# Patient Record
Sex: Male | Born: 1945 | Race: Black or African American | Hispanic: No | Marital: Married | Smoking: Former smoker
Health system: Southern US, Community
[De-identification: ages and names within clinical notes are randomized; demographics above are authoritative.]

## PROBLEM LIST (undated history)

## (undated) DIAGNOSIS — Z9889 Other specified postprocedural states: Secondary | ICD-10-CM

## (undated) DIAGNOSIS — I10 Essential (primary) hypertension: Secondary | ICD-10-CM

## (undated) DIAGNOSIS — A3790 Whooping cough, unspecified species without pneumonia: Secondary | ICD-10-CM

## (undated) DIAGNOSIS — E785 Hyperlipidemia, unspecified: Secondary | ICD-10-CM

## (undated) DIAGNOSIS — H409 Unspecified glaucoma: Secondary | ICD-10-CM

## (undated) DIAGNOSIS — E291 Testicular hypofunction: Secondary | ICD-10-CM

## (undated) DIAGNOSIS — G4730 Sleep apnea, unspecified: Secondary | ICD-10-CM

## (undated) HISTORY — PX: TONSILLECTOMY: SUR1361

## (undated) HISTORY — DX: Unspecified glaucoma: H40.9

## (undated) HISTORY — DX: Essential (primary) hypertension: I10

## (undated) HISTORY — DX: Other specified postprocedural states: Z98.890

## (undated) HISTORY — DX: Whooping cough, unspecified species without pneumonia: A37.90

## (undated) HISTORY — DX: Testicular hypofunction: E29.1

## (undated) HISTORY — DX: Sleep apnea, unspecified: G47.30

## (undated) HISTORY — DX: Hyperlipidemia, unspecified: E78.5

---

## 1997-09-26 ENCOUNTER — Ambulatory Visit (HOSPITAL_COMMUNITY): Admission: RE | Admit: 1997-09-26 | Discharge: 1997-09-26 | Payer: Self-pay | Admitting: Nephrology

## 1999-02-19 ENCOUNTER — Emergency Department (HOSPITAL_COMMUNITY): Admission: EM | Admit: 1999-02-19 | Discharge: 1999-02-19 | Payer: Self-pay | Admitting: Emergency Medicine

## 2000-08-19 ENCOUNTER — Ambulatory Visit (HOSPITAL_BASED_OUTPATIENT_CLINIC_OR_DEPARTMENT_OTHER): Admission: RE | Admit: 2000-08-19 | Discharge: 2000-08-19 | Payer: Self-pay | Admitting: Otolaryngology

## 2000-10-04 ENCOUNTER — Ambulatory Visit (HOSPITAL_BASED_OUTPATIENT_CLINIC_OR_DEPARTMENT_OTHER): Admission: RE | Admit: 2000-10-04 | Discharge: 2000-10-05 | Payer: Self-pay | Admitting: Otolaryngology

## 2000-10-04 ENCOUNTER — Encounter (INDEPENDENT_AMBULATORY_CARE_PROVIDER_SITE_OTHER): Payer: Self-pay | Admitting: Specialist

## 2001-01-24 ENCOUNTER — Ambulatory Visit (HOSPITAL_BASED_OUTPATIENT_CLINIC_OR_DEPARTMENT_OTHER): Admission: RE | Admit: 2001-01-24 | Discharge: 2001-01-24 | Payer: Self-pay | Admitting: Otolaryngology

## 2003-12-23 ENCOUNTER — Encounter: Admission: RE | Admit: 2003-12-23 | Discharge: 2003-12-23 | Payer: Self-pay | Admitting: Nephrology

## 2004-01-24 ENCOUNTER — Ambulatory Visit (HOSPITAL_COMMUNITY): Admission: RE | Admit: 2004-01-24 | Discharge: 2004-01-24 | Payer: Self-pay | Admitting: Nephrology

## 2004-02-20 ENCOUNTER — Ambulatory Visit (HOSPITAL_COMMUNITY): Admission: RE | Admit: 2004-02-20 | Discharge: 2004-02-20 | Payer: Self-pay | Admitting: Gastroenterology

## 2004-11-16 ENCOUNTER — Encounter: Admission: RE | Admit: 2004-11-16 | Discharge: 2004-11-16 | Payer: Self-pay | Admitting: Nephrology

## 2005-03-09 ENCOUNTER — Ambulatory Visit (HOSPITAL_COMMUNITY): Admission: RE | Admit: 2005-03-09 | Discharge: 2005-03-09 | Payer: Self-pay | Admitting: Nephrology

## 2005-05-26 ENCOUNTER — Encounter: Admission: RE | Admit: 2005-05-26 | Discharge: 2005-05-26 | Payer: Self-pay | Admitting: Nephrology

## 2005-08-30 ENCOUNTER — Emergency Department (HOSPITAL_COMMUNITY): Admission: EM | Admit: 2005-08-30 | Discharge: 2005-08-30 | Payer: Self-pay | Admitting: Emergency Medicine

## 2005-09-13 ENCOUNTER — Ambulatory Visit (HOSPITAL_COMMUNITY): Admission: RE | Admit: 2005-09-13 | Discharge: 2005-09-13 | Payer: Self-pay | Admitting: Nephrology

## 2006-08-21 ENCOUNTER — Emergency Department (HOSPITAL_COMMUNITY): Admission: EM | Admit: 2006-08-21 | Discharge: 2006-08-21 | Payer: Self-pay | Admitting: Emergency Medicine

## 2007-06-22 ENCOUNTER — Encounter: Admission: RE | Admit: 2007-06-22 | Discharge: 2007-06-22 | Payer: Self-pay | Admitting: Internal Medicine

## 2009-12-07 ENCOUNTER — Inpatient Hospital Stay (HOSPITAL_COMMUNITY): Admission: EM | Admit: 2009-12-07 | Discharge: 2009-12-10 | Payer: Self-pay | Admitting: Emergency Medicine

## 2009-12-09 ENCOUNTER — Encounter (INDEPENDENT_AMBULATORY_CARE_PROVIDER_SITE_OTHER): Payer: Self-pay | Admitting: Internal Medicine

## 2010-06-04 LAB — CBC
HCT: 31.1 % — ABNORMAL LOW (ref 39.0–52.0)
HCT: 31.3 % — ABNORMAL LOW (ref 39.0–52.0)
HCT: 33.7 % — ABNORMAL LOW (ref 39.0–52.0)
HCT: 35.8 % — ABNORMAL LOW (ref 39.0–52.0)
Hemoglobin: 10.5 g/dL — ABNORMAL LOW (ref 13.0–17.0)
Hemoglobin: 10.5 g/dL — ABNORMAL LOW (ref 13.0–17.0)
Hemoglobin: 10.6 g/dL — ABNORMAL LOW (ref 13.0–17.0)
Hemoglobin: 11 g/dL — ABNORMAL LOW (ref 13.0–17.0)
Hemoglobin: 11.2 g/dL — ABNORMAL LOW (ref 13.0–17.0)
Hemoglobin: 12 g/dL — ABNORMAL LOW (ref 13.0–17.0)
MCH: 28.6 pg (ref 26.0–34.0)
MCH: 28.6 pg (ref 26.0–34.0)
MCH: 28.9 pg (ref 26.0–34.0)
MCH: 29 pg (ref 26.0–34.0)
MCHC: 33.4 g/dL (ref 30.0–36.0)
MCHC: 33.5 g/dL (ref 30.0–36.0)
MCHC: 33.8 g/dL (ref 30.0–36.0)
MCV: 85.3 fL (ref 78.0–100.0)
MCV: 85.3 fL (ref 78.0–100.0)
MCV: 85.9 fL (ref 78.0–100.0)
MCV: 86.5 fL (ref 78.0–100.0)
Platelets: 149 10*3/uL — ABNORMAL LOW (ref 150–400)
Platelets: 168 10*3/uL (ref 150–400)
RBC: 3.62 MIL/uL — ABNORMAL LOW (ref 4.22–5.81)
RBC: 3.72 MIL/uL — ABNORMAL LOW (ref 4.22–5.81)
RBC: 3.84 MIL/uL — ABNORMAL LOW (ref 4.22–5.81)
RBC: 4.14 MIL/uL — ABNORMAL LOW (ref 4.22–5.81)
RDW: 13.9 % (ref 11.5–15.5)
RDW: 14.1 % (ref 11.5–15.5)
WBC: 3.4 10*3/uL — ABNORMAL LOW (ref 4.0–10.5)
WBC: 3.9 10*3/uL — ABNORMAL LOW (ref 4.0–10.5)
WBC: 4 10*3/uL (ref 4.0–10.5)
WBC: 7.1 10*3/uL (ref 4.0–10.5)

## 2010-06-04 LAB — URINALYSIS, ROUTINE W REFLEX MICROSCOPIC
Bilirubin Urine: NEGATIVE
Glucose, UA: NEGATIVE mg/dL
Hgb urine dipstick: NEGATIVE
Leukocytes, UA: NEGATIVE
Nitrite: NEGATIVE
Protein, ur: 30 mg/dL — AB
Specific Gravity, Urine: 1.033 — ABNORMAL HIGH (ref 1.005–1.030)
Urobilinogen, UA: 1 mg/dL (ref 0.0–1.0)
pH: 5.5 (ref 5.0–8.0)

## 2010-06-04 LAB — URINE MICROSCOPIC-ADD ON

## 2010-06-04 LAB — COMPREHENSIVE METABOLIC PANEL
ALT: 14 U/L (ref 0–53)
AST: 15 U/L (ref 0–37)
Albumin: 3 g/dL — ABNORMAL LOW (ref 3.5–5.2)
Alkaline Phosphatase: 71 U/L (ref 39–117)
BUN: 15 mg/dL (ref 6–23)
CO2: 31 mEq/L (ref 19–32)
Calcium: 8.7 mg/dL (ref 8.4–10.5)
Chloride: 109 mEq/L (ref 96–112)
Creatinine, Ser: 1.21 mg/dL (ref 0.4–1.5)
GFR calc Af Amer: >60 mL/min (ref >60)
GFR calc non Af Amer: >60 mL/min (ref >60)
Glucose, Bld: 104 mg/dL — ABNORMAL HIGH (ref 70–99)
Potassium: 3.4 mEq/L — ABNORMAL LOW (ref 3.5–5.1)
Sodium: 144 mEq/L (ref 135–145)
Total Bilirubin: 0.5 mg/dL (ref 0.3–1.2)
Total Protein: 5.6 g/dL — ABNORMAL LOW (ref 6.0–8.3)

## 2010-06-04 LAB — DIFFERENTIAL
Basophils Absolute: 0 10*3/uL (ref 0.0–0.1)
Basophils Absolute: 0 10*3/uL (ref 0.0–0.1)
Basophils Relative: 0 % (ref 0–1)
Basophils Relative: 0 % (ref 0–1)
Eosinophils Absolute: 0.2 10*3/uL (ref 0.0–0.7)
Eosinophils Absolute: 0.3 10*3/uL (ref 0.0–0.7)
Eosinophils Absolute: 0.3 10*3/uL (ref 0.0–0.7)
Eosinophils Relative: 3 % (ref 0–5)
Eosinophils Relative: 7 % — ABNORMAL HIGH (ref 0–5)
Eosinophils Relative: 9 % — ABNORMAL HIGH (ref 0–5)
Lymphocytes Relative: 13 % (ref 12–46)
Lymphocytes Relative: 36 % (ref 12–46)
Lymphs Abs: 0.9 10*3/uL (ref 0.7–4.0)
Lymphs Abs: 1.4 10*3/uL (ref 0.7–4.0)
Lymphs Abs: 1.6 10*3/uL (ref 0.7–4.0)
Monocytes Absolute: 0.3 10*3/uL (ref 0.1–1.0)
Monocytes Absolute: 0.3 10*3/uL (ref 0.1–1.0)
Monocytes Relative: 4 % (ref 3–12)
Monocytes Relative: 7 % (ref 3–12)
Monocytes Relative: 8 % (ref 3–12)
Neutro Abs: 2 10*3/uL (ref 1.7–7.7)
Neutro Abs: 5.7 10*3/uL (ref 1.7–7.7)
Neutrophils Relative %: 49 % (ref 43–77)
Neutrophils Relative %: 80 % — ABNORMAL HIGH (ref 43–77)

## 2010-06-04 LAB — PROTIME-INR
INR: 1.07 (ref 0.00–1.49)
Prothrombin Time: 14.1 seconds (ref 11.6–15.2)

## 2010-06-04 LAB — HEMOGLOBIN AND HEMATOCRIT, BLOOD
HCT: 32.2 % — ABNORMAL LOW (ref 39.0–52.0)
HCT: 32.6 % — ABNORMAL LOW (ref 39.0–52.0)
Hemoglobin: 10.7 g/dL — ABNORMAL LOW (ref 13.0–17.0)

## 2010-06-04 LAB — POCT I-STAT, CHEM 8
BUN: 21 mg/dL (ref 6–23)
Calcium, Ion: 1.18 mmol/L (ref 1.12–1.32)
Chloride: 106 mEq/L (ref 96–112)
Creatinine, Ser: 1.3 mg/dL (ref 0.4–1.5)
Glucose, Bld: 103 mg/dL — ABNORMAL HIGH (ref 70–99)
HCT: 37 % — ABNORMAL LOW (ref 39.0–52.0)
Hemoglobin: 12.6 g/dL — ABNORMAL LOW (ref 13.0–17.0)
Potassium: 3.6 mEq/L (ref 3.5–5.1)
Sodium: 144 mEq/L (ref 135–145)
TCO2: 29 mmol/L (ref 0–100)

## 2010-06-04 LAB — ABO/RH: ABO/RH(D): O POS

## 2010-06-04 LAB — HEMOCCULT GUIAC POC 1CARD (OFFICE): Fecal Occult Bld: POSITIVE

## 2010-06-04 LAB — TYPE AND SCREEN
ABO/RH(D): O POS
Antibody Screen: NEGATIVE

## 2010-06-04 LAB — APTT: aPTT: 31 seconds (ref 24–37)

## 2010-06-04 LAB — MAGNESIUM: Magnesium: 2.2 mg/dL (ref 1.5–2.5)

## 2010-08-07 NOTE — Op Note (Signed)
[place]  Patient:    Richard Hudson, Richard Hudson                     MRN: [id number] Proc. Date: 10/04/00 Adm. Date:  [id number] Attending:  Carlean Purl CC:         Jarome Matin, M.D.   Operative Report  PREOPERATIVE DIAGNOSES: 1. Obstructive sleep apnea. 2. Turbinate hypertrophy with nasal obstruction.  POSTOPERATIVE DIAGNOSES: 1. Obstructive sleep apnea. 2. Turbinate hypertrophy with nasal obstruction.  OPERATION: 1. Uvulopalatopharyngoplasty with tonsillectomy. 2. Bilateral inferior turbinate reductions.  SURGEON:  Kristine Garbe. Ezzard Standing, M.D.  ANESTHESIA:  General endotracheal.  COMPLICATIONS:  None.  BRIEF CLINICAL NOTE:  Richard Hudson is a 65 year old gentleman who has history of heavy snoring as well as some obstructive symptoms at night.  He had a sleep test which demonstrated an RDI of 34 with O2 saturation down to the mid 80s.  He did not tolerate the nasal CPAP trial.  He is taken to the operating room at this time for a UPP and a turbinate reduction.  DESCRIPTION OF PROCEDURE:  After adequate endotracheal anesthesia, the patient received 1 gm Ancef preoperatively and 12 mg of Decadron IV intraoperatively. Mouth gag was used to expose the oral pharynx.  The left and right tonsils were resected from the tonsillar pockets using a cautery.  After removing the tonsils, the distal 1/2 cm of soft palpate in the uvula was transected at its base.  Hemostasis was obtained with cautery.  The mucosal edges on either side of the soft palpate were then reapproximated with 3-0 Vicryl and 3-0 chromic sutures.  This completed the UPP and tonsillectomy.  Next, the nose was examined.  The inferior turbinates were decongested with cotton pledgets soaked in decongestants.  Richard Hudson had large swollen inferior turbinates. Bipolar cautery was used to perform submucosal cauterization of the turbinate. The patient had a slight septal  deflection to the left.  The inferior turbinates were then out fractured.  This completed the procedure.  Richard Hudson was awakened from anesthesia and transferred to the recovery room postoperative doing well.  DISPOSITION:  Richard Hudson will be observed overnight in the recovery care center and discharged home in the morning on Keflex suspension 250 t.i.d. for one week, Tylenol and Lortab elixir 15 to 30 cc q.4h. p.r.n. pain.  Will have him follow up in my office in two weeks for recheck. DD:  10/04/00 TD:  10/04/00 Job: [id number]

## 2010-08-07 NOTE — Op Note (Signed)
NAMEGID, SCHOFFSTALL              ACCOUNT NO.:  [id number]   MEDICAL RECORD NO.:  [id number]          PATIENT TYPE:  AMB   LOCATION:  ENDO                         FACILITY:  [facility]   PHYSICIAN:  James L. Malon Kindle., M.D.DATE OF BIRTH:  Sep 06, 1945   DATE OF PROCEDURE:  02/20/2004  DATE OF DISCHARGE:                                 OPERATIVE REPORT   PROCEDURE PERFORMED:  Colonoscopy.   ENDOSCOPIST:  Llana Aliment. Randa Evens, M.D.   MEDICATIONS:  Fentanyl 75 mcg, Versed 10 mg IV.   INDICATIONS FOR PROCEDURE:  Colon cancer screening.   DESCRIPTION OF PROCEDURE:  The procedure had been explained to the patient  and consent obtained.  With the patient in the left lateral decubitus  position, the pediatric adjustable Olympus colonoscope was inserted and  advanced.  The prep was adequate and we were able to advance to the cecum.  The ileocecal valve and appendiceal orifice were seen.  The scope was  withdrawn and the cecum, ascending colon, transverse colon, descending and  sigmoid colon were seen well upon removal.  No polyps or other lesions were  seen.  The scope was withdrawn to the rectum.  The rectum was free of  polyps.  The scope was withdrawn.  The patient tolerated the procedure well.   ASSESSMENT:  Normal screening colonoscopy, [id number].   PLAN:  Will recommend yearly Hemoccults and follow-up in the future with  Jarome Matin, M.D.  Repeat colonoscopy for specific indications.       JLE/MEDQ  D:  02/20/2004  T:  02/20/2004  Job:  [id number]   cc:   Jarome Matin, M.D.  [address] [place]  [place] [address]  Fax: [phone]

## 2011-04-28 DIAGNOSIS — I10 Essential (primary) hypertension: Secondary | ICD-10-CM | POA: Diagnosis not present

## 2011-07-06 DIAGNOSIS — N400 Enlarged prostate without lower urinary tract symptoms: Secondary | ICD-10-CM | POA: Diagnosis not present

## 2011-07-06 DIAGNOSIS — E291 Testicular hypofunction: Secondary | ICD-10-CM | POA: Diagnosis not present

## 2011-07-06 DIAGNOSIS — R109 Unspecified abdominal pain: Secondary | ICD-10-CM | POA: Diagnosis not present

## 2011-07-06 DIAGNOSIS — E785 Hyperlipidemia, unspecified: Secondary | ICD-10-CM | POA: Diagnosis not present

## 2011-07-06 DIAGNOSIS — Z23 Encounter for immunization: Secondary | ICD-10-CM | POA: Diagnosis not present

## 2011-07-06 DIAGNOSIS — Z0000 Encounter for general adult medical examination without abnormal findings: Secondary | ICD-10-CM | POA: Diagnosis not present

## 2011-07-06 DIAGNOSIS — Z79899 Other long term (current) drug therapy: Secondary | ICD-10-CM | POA: Diagnosis not present

## 2011-07-06 DIAGNOSIS — I10 Essential (primary) hypertension: Secondary | ICD-10-CM | POA: Diagnosis not present

## 2011-07-28 DIAGNOSIS — H4011X0 Primary open-angle glaucoma, stage unspecified: Secondary | ICD-10-CM | POA: Diagnosis not present

## 2011-07-28 DIAGNOSIS — H40029 Open angle with borderline findings, high risk, unspecified eye: Secondary | ICD-10-CM | POA: Diagnosis not present

## 2011-07-28 DIAGNOSIS — H409 Unspecified glaucoma: Secondary | ICD-10-CM | POA: Diagnosis not present

## 2011-08-31 DIAGNOSIS — H4011X0 Primary open-angle glaucoma, stage unspecified: Secondary | ICD-10-CM | POA: Diagnosis not present

## 2011-08-31 DIAGNOSIS — H02139 Senile ectropion of unspecified eye, unspecified eyelid: Secondary | ICD-10-CM | POA: Diagnosis not present

## 2011-08-31 DIAGNOSIS — H40029 Open angle with borderline findings, high risk, unspecified eye: Secondary | ICD-10-CM | POA: Diagnosis not present

## 2011-08-31 DIAGNOSIS — H409 Unspecified glaucoma: Secondary | ICD-10-CM | POA: Diagnosis not present

## 2011-10-18 DIAGNOSIS — H01029 Squamous blepharitis unspecified eye, unspecified eyelid: Secondary | ICD-10-CM | POA: Diagnosis not present

## 2011-10-18 DIAGNOSIS — H04569 Stenosis of unspecified lacrimal punctum: Secondary | ICD-10-CM | POA: Diagnosis not present

## 2011-10-18 DIAGNOSIS — M2420 Disorder of ligament, unspecified site: Secondary | ICD-10-CM | POA: Diagnosis not present

## 2011-10-18 DIAGNOSIS — H11439 Conjunctival hyperemia, unspecified eye: Secondary | ICD-10-CM | POA: Diagnosis not present

## 2011-10-18 DIAGNOSIS — H04129 Dry eye syndrome of unspecified lacrimal gland: Secondary | ICD-10-CM | POA: Diagnosis not present

## 2011-10-18 DIAGNOSIS — H02139 Senile ectropion of unspecified eye, unspecified eyelid: Secondary | ICD-10-CM | POA: Diagnosis not present

## 2011-12-27 DIAGNOSIS — I10 Essential (primary) hypertension: Secondary | ICD-10-CM | POA: Diagnosis not present

## 2012-01-06 DIAGNOSIS — N400 Enlarged prostate without lower urinary tract symptoms: Secondary | ICD-10-CM | POA: Diagnosis not present

## 2012-01-06 DIAGNOSIS — E785 Hyperlipidemia, unspecified: Secondary | ICD-10-CM | POA: Diagnosis not present

## 2012-01-06 DIAGNOSIS — R809 Proteinuria, unspecified: Secondary | ICD-10-CM | POA: Diagnosis not present

## 2012-01-06 DIAGNOSIS — E291 Testicular hypofunction: Secondary | ICD-10-CM | POA: Diagnosis not present

## 2012-01-06 DIAGNOSIS — I10 Essential (primary) hypertension: Secondary | ICD-10-CM | POA: Diagnosis not present

## 2012-01-28 DIAGNOSIS — I77819 Aortic ectasia, unspecified site: Secondary | ICD-10-CM | POA: Diagnosis not present

## 2012-02-22 DIAGNOSIS — H9319 Tinnitus, unspecified ear: Secondary | ICD-10-CM | POA: Diagnosis not present

## 2012-02-29 DIAGNOSIS — H02109 Unspecified ectropion of unspecified eye, unspecified eyelid: Secondary | ICD-10-CM | POA: Diagnosis not present

## 2012-03-02 DIAGNOSIS — H4011X0 Primary open-angle glaucoma, stage unspecified: Secondary | ICD-10-CM | POA: Diagnosis not present

## 2012-03-02 DIAGNOSIS — H40029 Open angle with borderline findings, high risk, unspecified eye: Secondary | ICD-10-CM | POA: Diagnosis not present

## 2012-03-02 DIAGNOSIS — H02139 Senile ectropion of unspecified eye, unspecified eyelid: Secondary | ICD-10-CM | POA: Diagnosis not present

## 2012-03-29 DIAGNOSIS — H9319 Tinnitus, unspecified ear: Secondary | ICD-10-CM | POA: Diagnosis not present

## 2012-03-29 DIAGNOSIS — H903 Sensorineural hearing loss, bilateral: Secondary | ICD-10-CM | POA: Diagnosis not present

## 2012-03-29 DIAGNOSIS — H905 Unspecified sensorineural hearing loss: Secondary | ICD-10-CM | POA: Diagnosis not present

## 2012-04-06 DIAGNOSIS — I10 Essential (primary) hypertension: Secondary | ICD-10-CM | POA: Diagnosis not present

## 2012-04-06 DIAGNOSIS — Z01818 Encounter for other preprocedural examination: Secondary | ICD-10-CM | POA: Diagnosis not present

## 2012-04-06 DIAGNOSIS — H02109 Unspecified ectropion of unspecified eye, unspecified eyelid: Secondary | ICD-10-CM | POA: Diagnosis not present

## 2012-04-06 DIAGNOSIS — I498 Other specified cardiac arrhythmias: Secondary | ICD-10-CM | POA: Diagnosis not present

## 2012-04-17 DIAGNOSIS — I10 Essential (primary) hypertension: Secondary | ICD-10-CM | POA: Diagnosis not present

## 2012-04-17 DIAGNOSIS — E785 Hyperlipidemia, unspecified: Secondary | ICD-10-CM | POA: Diagnosis not present

## 2012-04-17 DIAGNOSIS — H02109 Unspecified ectropion of unspecified eye, unspecified eyelid: Secondary | ICD-10-CM | POA: Diagnosis not present

## 2012-04-24 DIAGNOSIS — Z9889 Other specified postprocedural states: Secondary | ICD-10-CM | POA: Diagnosis not present

## 2012-05-23 DIAGNOSIS — Z48810 Encounter for surgical aftercare following surgery on the sense organs: Secondary | ICD-10-CM | POA: Diagnosis not present

## 2012-05-23 DIAGNOSIS — H02109 Unspecified ectropion of unspecified eye, unspecified eyelid: Secondary | ICD-10-CM | POA: Diagnosis not present

## 2012-05-23 DIAGNOSIS — Z87891 Personal history of nicotine dependence: Secondary | ICD-10-CM | POA: Diagnosis not present

## 2012-07-04 DIAGNOSIS — I129 Hypertensive chronic kidney disease with stage 1 through stage 4 chronic kidney disease, or unspecified chronic kidney disease: Secondary | ICD-10-CM | POA: Diagnosis not present

## 2012-07-04 DIAGNOSIS — H9319 Tinnitus, unspecified ear: Secondary | ICD-10-CM | POA: Diagnosis not present

## 2012-07-04 DIAGNOSIS — E785 Hyperlipidemia, unspecified: Secondary | ICD-10-CM | POA: Diagnosis not present

## 2012-07-04 DIAGNOSIS — N182 Chronic kidney disease, stage 2 (mild): Secondary | ICD-10-CM | POA: Diagnosis not present

## 2012-07-04 DIAGNOSIS — E291 Testicular hypofunction: Secondary | ICD-10-CM | POA: Diagnosis not present

## 2012-07-04 DIAGNOSIS — N400 Enlarged prostate without lower urinary tract symptoms: Secondary | ICD-10-CM | POA: Diagnosis not present

## 2012-07-04 DIAGNOSIS — Z0000 Encounter for general adult medical examination without abnormal findings: Secondary | ICD-10-CM | POA: Diagnosis not present

## 2012-08-29 DIAGNOSIS — H40059 Ocular hypertension, unspecified eye: Secondary | ICD-10-CM | POA: Diagnosis not present

## 2012-08-29 DIAGNOSIS — H40029 Open angle with borderline findings, high risk, unspecified eye: Secondary | ICD-10-CM | POA: Diagnosis not present

## 2013-01-24 DIAGNOSIS — Z79899 Other long term (current) drug therapy: Secondary | ICD-10-CM | POA: Diagnosis not present

## 2013-01-24 DIAGNOSIS — I129 Hypertensive chronic kidney disease with stage 1 through stage 4 chronic kidney disease, or unspecified chronic kidney disease: Secondary | ICD-10-CM | POA: Diagnosis not present

## 2013-01-24 DIAGNOSIS — E785 Hyperlipidemia, unspecified: Secondary | ICD-10-CM | POA: Diagnosis not present

## 2013-01-24 DIAGNOSIS — N400 Enlarged prostate without lower urinary tract symptoms: Secondary | ICD-10-CM | POA: Diagnosis not present

## 2013-01-24 DIAGNOSIS — N182 Chronic kidney disease, stage 2 (mild): Secondary | ICD-10-CM | POA: Diagnosis not present

## 2013-01-24 DIAGNOSIS — E291 Testicular hypofunction: Secondary | ICD-10-CM | POA: Diagnosis not present

## 2013-02-27 DIAGNOSIS — H2510 Age-related nuclear cataract, unspecified eye: Secondary | ICD-10-CM | POA: Diagnosis not present

## 2013-02-27 DIAGNOSIS — H40019 Open angle with borderline findings, low risk, unspecified eye: Secondary | ICD-10-CM | POA: Diagnosis not present

## 2013-02-27 DIAGNOSIS — H0230 Blepharochalasis unspecified eye, unspecified eyelid: Secondary | ICD-10-CM | POA: Diagnosis not present

## 2013-02-27 DIAGNOSIS — H4011X0 Primary open-angle glaucoma, stage unspecified: Secondary | ICD-10-CM | POA: Diagnosis not present

## 2013-07-05 DIAGNOSIS — E785 Hyperlipidemia, unspecified: Secondary | ICD-10-CM | POA: Diagnosis not present

## 2013-07-05 DIAGNOSIS — N400 Enlarged prostate without lower urinary tract symptoms: Secondary | ICD-10-CM | POA: Diagnosis not present

## 2013-07-05 DIAGNOSIS — L909 Atrophic disorder of skin, unspecified: Secondary | ICD-10-CM | POA: Diagnosis not present

## 2013-07-05 DIAGNOSIS — I129 Hypertensive chronic kidney disease with stage 1 through stage 4 chronic kidney disease, or unspecified chronic kidney disease: Secondary | ICD-10-CM | POA: Diagnosis not present

## 2013-07-05 DIAGNOSIS — Z79899 Other long term (current) drug therapy: Secondary | ICD-10-CM | POA: Diagnosis not present

## 2013-07-05 DIAGNOSIS — N182 Chronic kidney disease, stage 2 (mild): Secondary | ICD-10-CM | POA: Diagnosis not present

## 2013-07-05 DIAGNOSIS — L919 Hypertrophic disorder of the skin, unspecified: Secondary | ICD-10-CM | POA: Diagnosis not present

## 2013-07-05 DIAGNOSIS — E291 Testicular hypofunction: Secondary | ICD-10-CM | POA: Diagnosis not present

## 2013-07-05 DIAGNOSIS — Z0000 Encounter for general adult medical examination without abnormal findings: Secondary | ICD-10-CM | POA: Diagnosis not present

## 2013-08-28 DIAGNOSIS — H40019 Open angle with borderline findings, low risk, unspecified eye: Secondary | ICD-10-CM | POA: Diagnosis not present

## 2013-08-28 DIAGNOSIS — H2510 Age-related nuclear cataract, unspecified eye: Secondary | ICD-10-CM | POA: Diagnosis not present

## 2013-08-28 DIAGNOSIS — H4011X0 Primary open-angle glaucoma, stage unspecified: Secondary | ICD-10-CM | POA: Diagnosis not present

## 2013-10-10 DIAGNOSIS — D485 Neoplasm of uncertain behavior of skin: Secondary | ICD-10-CM | POA: Diagnosis not present

## 2013-11-05 DIAGNOSIS — N318 Other neuromuscular dysfunction of bladder: Secondary | ICD-10-CM | POA: Diagnosis not present

## 2013-11-05 DIAGNOSIS — N320 Bladder-neck obstruction: Secondary | ICD-10-CM | POA: Diagnosis not present

## 2013-12-26 DIAGNOSIS — R350 Frequency of micturition: Secondary | ICD-10-CM | POA: Diagnosis not present

## 2013-12-26 DIAGNOSIS — R351 Nocturia: Secondary | ICD-10-CM | POA: Diagnosis not present

## 2013-12-26 DIAGNOSIS — R3911 Hesitancy of micturition: Secondary | ICD-10-CM | POA: Diagnosis not present

## 2013-12-26 DIAGNOSIS — R3912 Poor urinary stream: Secondary | ICD-10-CM | POA: Diagnosis not present

## 2013-12-26 DIAGNOSIS — R3915 Urgency of urination: Secondary | ICD-10-CM | POA: Diagnosis not present

## 2014-01-04 DIAGNOSIS — I129 Hypertensive chronic kidney disease with stage 1 through stage 4 chronic kidney disease, or unspecified chronic kidney disease: Secondary | ICD-10-CM | POA: Diagnosis not present

## 2014-01-04 DIAGNOSIS — N182 Chronic kidney disease, stage 2 (mild): Secondary | ICD-10-CM | POA: Diagnosis not present

## 2014-01-04 DIAGNOSIS — N400 Enlarged prostate without lower urinary tract symptoms: Secondary | ICD-10-CM | POA: Diagnosis not present

## 2014-01-04 DIAGNOSIS — Z5181 Encounter for therapeutic drug level monitoring: Secondary | ICD-10-CM | POA: Diagnosis not present

## 2014-01-04 DIAGNOSIS — E785 Hyperlipidemia, unspecified: Secondary | ICD-10-CM | POA: Diagnosis not present

## 2014-01-04 DIAGNOSIS — E291 Testicular hypofunction: Secondary | ICD-10-CM | POA: Diagnosis not present

## 2014-01-07 ENCOUNTER — Other Ambulatory Visit: Payer: Self-pay | Admitting: Family Medicine

## 2014-01-07 DIAGNOSIS — I714 Abdominal aortic aneurysm, without rupture, unspecified: Secondary | ICD-10-CM

## 2014-01-17 ENCOUNTER — Ambulatory Visit
Admission: RE | Admit: 2014-01-17 | Discharge: 2014-01-17 | Disposition: A | Discharge status: Home / Self Care | Payer: Medicare Other | Source: Ambulatory Visit | Attending: Family Medicine | Admitting: Family Medicine

## 2014-01-17 DIAGNOSIS — I714 Abdominal aortic aneurysm, without rupture, unspecified: Secondary | ICD-10-CM

## 2014-01-18 DIAGNOSIS — R3915 Urgency of urination: Secondary | ICD-10-CM | POA: Diagnosis not present

## 2014-01-18 DIAGNOSIS — R3912 Poor urinary stream: Secondary | ICD-10-CM | POA: Diagnosis not present

## 2014-02-01 DIAGNOSIS — R350 Frequency of micturition: Secondary | ICD-10-CM | POA: Diagnosis not present

## 2014-02-01 DIAGNOSIS — N320 Bladder-neck obstruction: Secondary | ICD-10-CM | POA: Diagnosis not present

## 2014-02-27 DIAGNOSIS — H4011X2 Primary open-angle glaucoma, moderate stage: Secondary | ICD-10-CM | POA: Diagnosis not present

## 2014-02-27 DIAGNOSIS — H2513 Age-related nuclear cataract, bilateral: Secondary | ICD-10-CM | POA: Diagnosis not present

## 2014-02-27 DIAGNOSIS — H4011X1 Primary open-angle glaucoma, mild stage: Secondary | ICD-10-CM | POA: Diagnosis not present

## 2014-04-03 DIAGNOSIS — H2513 Age-related nuclear cataract, bilateral: Secondary | ICD-10-CM | POA: Diagnosis not present

## 2014-04-03 DIAGNOSIS — H4011X1 Primary open-angle glaucoma, mild stage: Secondary | ICD-10-CM | POA: Diagnosis not present

## 2014-04-03 DIAGNOSIS — H4011X2 Primary open-angle glaucoma, moderate stage: Secondary | ICD-10-CM | POA: Diagnosis not present

## 2014-05-10 DIAGNOSIS — N3281 Overactive bladder: Secondary | ICD-10-CM | POA: Diagnosis not present

## 2014-07-08 DIAGNOSIS — E291 Testicular hypofunction: Secondary | ICD-10-CM | POA: Diagnosis not present

## 2014-07-08 DIAGNOSIS — N182 Chronic kidney disease, stage 2 (mild): Secondary | ICD-10-CM | POA: Diagnosis not present

## 2014-07-08 DIAGNOSIS — E785 Hyperlipidemia, unspecified: Secondary | ICD-10-CM | POA: Diagnosis not present

## 2014-07-08 DIAGNOSIS — I129 Hypertensive chronic kidney disease with stage 1 through stage 4 chronic kidney disease, or unspecified chronic kidney disease: Secondary | ICD-10-CM | POA: Diagnosis not present

## 2014-07-08 DIAGNOSIS — N400 Enlarged prostate without lower urinary tract symptoms: Secondary | ICD-10-CM | POA: Diagnosis not present

## 2014-07-08 DIAGNOSIS — Z79899 Other long term (current) drug therapy: Secondary | ICD-10-CM | POA: Diagnosis not present

## 2014-07-08 DIAGNOSIS — Z0000 Encounter for general adult medical examination without abnormal findings: Secondary | ICD-10-CM | POA: Diagnosis not present

## 2014-10-15 DIAGNOSIS — H4011X1 Primary open-angle glaucoma, mild stage: Secondary | ICD-10-CM | POA: Diagnosis not present

## 2014-10-15 DIAGNOSIS — H4011X3 Primary open-angle glaucoma, severe stage: Secondary | ICD-10-CM | POA: Diagnosis not present

## 2014-10-15 DIAGNOSIS — H2513 Age-related nuclear cataract, bilateral: Secondary | ICD-10-CM | POA: Diagnosis not present

## 2014-10-15 DIAGNOSIS — E291 Testicular hypofunction: Secondary | ICD-10-CM | POA: Diagnosis not present

## 2014-10-30 DIAGNOSIS — H4011X2 Primary open-angle glaucoma, moderate stage: Secondary | ICD-10-CM | POA: Diagnosis not present

## 2014-11-09 DIAGNOSIS — Z043 Encounter for examination and observation following other accident: Secondary | ICD-10-CM | POA: Diagnosis not present

## 2014-11-09 DIAGNOSIS — I10 Essential (primary) hypertension: Secondary | ICD-10-CM | POA: Diagnosis not present

## 2014-11-09 DIAGNOSIS — E780 Pure hypercholesterolemia: Secondary | ICD-10-CM | POA: Diagnosis not present

## 2014-11-22 DIAGNOSIS — H4011X1 Primary open-angle glaucoma, mild stage: Secondary | ICD-10-CM | POA: Diagnosis not present

## 2015-01-21 DIAGNOSIS — H401112 Primary open-angle glaucoma, right eye, moderate stage: Secondary | ICD-10-CM | POA: Diagnosis not present

## 2015-01-21 DIAGNOSIS — H401123 Primary open-angle glaucoma, left eye, severe stage: Secondary | ICD-10-CM | POA: Diagnosis not present

## 2015-02-20 DIAGNOSIS — N182 Chronic kidney disease, stage 2 (mild): Secondary | ICD-10-CM | POA: Diagnosis not present

## 2015-02-20 DIAGNOSIS — I129 Hypertensive chronic kidney disease with stage 1 through stage 4 chronic kidney disease, or unspecified chronic kidney disease: Secondary | ICD-10-CM | POA: Diagnosis not present

## 2015-02-20 DIAGNOSIS — Z79899 Other long term (current) drug therapy: Secondary | ICD-10-CM | POA: Diagnosis not present

## 2015-02-20 DIAGNOSIS — E785 Hyperlipidemia, unspecified: Secondary | ICD-10-CM | POA: Diagnosis not present

## 2015-02-20 DIAGNOSIS — E291 Testicular hypofunction: Secondary | ICD-10-CM | POA: Diagnosis not present

## 2015-02-20 DIAGNOSIS — N400 Enlarged prostate without lower urinary tract symptoms: Secondary | ICD-10-CM | POA: Diagnosis not present

## 2015-04-23 DIAGNOSIS — H401112 Primary open-angle glaucoma, right eye, moderate stage: Secondary | ICD-10-CM | POA: Diagnosis not present

## 2015-04-23 DIAGNOSIS — H401123 Primary open-angle glaucoma, left eye, severe stage: Secondary | ICD-10-CM | POA: Diagnosis not present

## 2015-05-14 DIAGNOSIS — J209 Acute bronchitis, unspecified: Secondary | ICD-10-CM | POA: Diagnosis not present

## 2015-06-20 DIAGNOSIS — Z0000 Encounter for general adult medical examination without abnormal findings: Secondary | ICD-10-CM | POA: Diagnosis not present

## 2015-06-20 DIAGNOSIS — N320 Bladder-neck obstruction: Secondary | ICD-10-CM | POA: Diagnosis not present

## 2015-06-20 DIAGNOSIS — N5201 Erectile dysfunction due to arterial insufficiency: Secondary | ICD-10-CM | POA: Diagnosis not present

## 2015-06-20 DIAGNOSIS — N3281 Overactive bladder: Secondary | ICD-10-CM | POA: Diagnosis not present

## 2015-07-21 ENCOUNTER — Other Ambulatory Visit: Payer: Self-pay | Admitting: Gastroenterology

## 2015-07-21 DIAGNOSIS — D126 Benign neoplasm of colon, unspecified: Secondary | ICD-10-CM | POA: Diagnosis not present

## 2015-07-21 DIAGNOSIS — Z1211 Encounter for screening for malignant neoplasm of colon: Secondary | ICD-10-CM | POA: Diagnosis not present

## 2015-07-21 DIAGNOSIS — K5730 Diverticulosis of large intestine without perforation or abscess without bleeding: Secondary | ICD-10-CM | POA: Diagnosis not present

## 2015-07-21 DIAGNOSIS — Z800 Family history of malignant neoplasm of digestive organs: Secondary | ICD-10-CM | POA: Diagnosis not present

## 2015-07-21 DIAGNOSIS — D120 Benign neoplasm of cecum: Secondary | ICD-10-CM | POA: Diagnosis not present

## 2015-07-23 DIAGNOSIS — J209 Acute bronchitis, unspecified: Secondary | ICD-10-CM | POA: Diagnosis not present

## 2015-08-13 DIAGNOSIS — N3281 Overactive bladder: Secondary | ICD-10-CM | POA: Diagnosis not present

## 2015-08-13 DIAGNOSIS — N5201 Erectile dysfunction due to arterial insufficiency: Secondary | ICD-10-CM | POA: Diagnosis not present

## 2015-08-21 DIAGNOSIS — R05 Cough: Secondary | ICD-10-CM | POA: Diagnosis not present

## 2015-08-21 DIAGNOSIS — N182 Chronic kidney disease, stage 2 (mild): Secondary | ICD-10-CM | POA: Diagnosis not present

## 2015-08-21 DIAGNOSIS — N400 Enlarged prostate without lower urinary tract symptoms: Secondary | ICD-10-CM | POA: Diagnosis not present

## 2015-08-21 DIAGNOSIS — E6609 Other obesity due to excess calories: Secondary | ICD-10-CM | POA: Diagnosis not present

## 2015-08-21 DIAGNOSIS — E291 Testicular hypofunction: Secondary | ICD-10-CM | POA: Diagnosis not present

## 2015-08-21 DIAGNOSIS — I129 Hypertensive chronic kidney disease with stage 1 through stage 4 chronic kidney disease, or unspecified chronic kidney disease: Secondary | ICD-10-CM | POA: Diagnosis not present

## 2015-08-21 DIAGNOSIS — E785 Hyperlipidemia, unspecified: Secondary | ICD-10-CM | POA: Diagnosis not present

## 2015-08-21 DIAGNOSIS — Z0000 Encounter for general adult medical examination without abnormal findings: Secondary | ICD-10-CM | POA: Diagnosis not present

## 2015-08-26 DIAGNOSIS — H401132 Primary open-angle glaucoma, bilateral, moderate stage: Secondary | ICD-10-CM | POA: Diagnosis not present

## 2015-08-26 DIAGNOSIS — H2513 Age-related nuclear cataract, bilateral: Secondary | ICD-10-CM | POA: Diagnosis not present

## 2015-10-01 DIAGNOSIS — N5201 Erectile dysfunction due to arterial insufficiency: Secondary | ICD-10-CM | POA: Diagnosis not present

## 2015-10-01 DIAGNOSIS — R3915 Urgency of urination: Secondary | ICD-10-CM | POA: Diagnosis not present

## 2015-12-26 DIAGNOSIS — H401112 Primary open-angle glaucoma, right eye, moderate stage: Secondary | ICD-10-CM | POA: Diagnosis not present

## 2015-12-26 DIAGNOSIS — H2513 Age-related nuclear cataract, bilateral: Secondary | ICD-10-CM | POA: Diagnosis not present

## 2015-12-26 DIAGNOSIS — H401123 Primary open-angle glaucoma, left eye, severe stage: Secondary | ICD-10-CM | POA: Diagnosis not present

## 2016-02-03 ENCOUNTER — Other Ambulatory Visit: Payer: Self-pay | Admitting: Family Medicine

## 2016-02-03 ENCOUNTER — Ambulatory Visit
Admission: RE | Admit: 2016-02-03 | Discharge: 2016-02-03 | Disposition: A | Discharge status: Home / Self Care | Payer: Medicare Other | Source: Ambulatory Visit | Attending: Family Medicine | Admitting: Family Medicine

## 2016-02-03 DIAGNOSIS — M25551 Pain in right hip: Secondary | ICD-10-CM | POA: Diagnosis not present

## 2016-02-03 DIAGNOSIS — I129 Hypertensive chronic kidney disease with stage 1 through stage 4 chronic kidney disease, or unspecified chronic kidney disease: Secondary | ICD-10-CM | POA: Diagnosis not present

## 2016-02-20 DIAGNOSIS — E785 Hyperlipidemia, unspecified: Secondary | ICD-10-CM | POA: Diagnosis not present

## 2016-02-20 DIAGNOSIS — I129 Hypertensive chronic kidney disease with stage 1 through stage 4 chronic kidney disease, or unspecified chronic kidney disease: Secondary | ICD-10-CM | POA: Diagnosis not present

## 2016-02-20 DIAGNOSIS — R6889 Other general symptoms and signs: Secondary | ICD-10-CM | POA: Diagnosis not present

## 2016-02-20 DIAGNOSIS — N182 Chronic kidney disease, stage 2 (mild): Secondary | ICD-10-CM | POA: Diagnosis not present

## 2016-02-20 DIAGNOSIS — R079 Chest pain, unspecified: Secondary | ICD-10-CM | POA: Diagnosis not present

## 2016-04-02 DIAGNOSIS — N3281 Overactive bladder: Secondary | ICD-10-CM | POA: Diagnosis not present

## 2016-04-02 DIAGNOSIS — N5201 Erectile dysfunction due to arterial insufficiency: Secondary | ICD-10-CM | POA: Diagnosis not present

## 2016-04-02 DIAGNOSIS — R3915 Urgency of urination: Secondary | ICD-10-CM | POA: Diagnosis not present

## 2016-04-27 DIAGNOSIS — H401112 Primary open-angle glaucoma, right eye, moderate stage: Secondary | ICD-10-CM | POA: Diagnosis not present

## 2016-04-27 DIAGNOSIS — H401123 Primary open-angle glaucoma, left eye, severe stage: Secondary | ICD-10-CM | POA: Diagnosis not present

## 2016-04-27 DIAGNOSIS — H2513 Age-related nuclear cataract, bilateral: Secondary | ICD-10-CM | POA: Diagnosis not present

## 2016-08-25 DIAGNOSIS — H2513 Age-related nuclear cataract, bilateral: Secondary | ICD-10-CM | POA: Diagnosis not present

## 2016-08-25 DIAGNOSIS — H401112 Primary open-angle glaucoma, right eye, moderate stage: Secondary | ICD-10-CM | POA: Diagnosis not present

## 2016-08-25 DIAGNOSIS — H401123 Primary open-angle glaucoma, left eye, severe stage: Secondary | ICD-10-CM | POA: Diagnosis not present

## 2016-12-24 DIAGNOSIS — I129 Hypertensive chronic kidney disease with stage 1 through stage 4 chronic kidney disease, or unspecified chronic kidney disease: Secondary | ICD-10-CM | POA: Diagnosis not present

## 2016-12-24 DIAGNOSIS — E785 Hyperlipidemia, unspecified: Secondary | ICD-10-CM | POA: Diagnosis not present

## 2016-12-24 DIAGNOSIS — Z125 Encounter for screening for malignant neoplasm of prostate: Secondary | ICD-10-CM | POA: Diagnosis not present

## 2016-12-24 DIAGNOSIS — N182 Chronic kidney disease, stage 2 (mild): Secondary | ICD-10-CM | POA: Diagnosis not present

## 2016-12-24 DIAGNOSIS — E6609 Other obesity due to excess calories: Secondary | ICD-10-CM | POA: Diagnosis not present

## 2016-12-29 DIAGNOSIS — E785 Hyperlipidemia, unspecified: Secondary | ICD-10-CM | POA: Diagnosis not present

## 2016-12-29 DIAGNOSIS — I129 Hypertensive chronic kidney disease with stage 1 through stage 4 chronic kidney disease, or unspecified chronic kidney disease: Secondary | ICD-10-CM | POA: Diagnosis not present

## 2016-12-29 DIAGNOSIS — Z125 Encounter for screening for malignant neoplasm of prostate: Secondary | ICD-10-CM | POA: Diagnosis not present

## 2017-02-24 DIAGNOSIS — H401123 Primary open-angle glaucoma, left eye, severe stage: Secondary | ICD-10-CM | POA: Diagnosis not present

## 2017-02-24 DIAGNOSIS — H401111 Primary open-angle glaucoma, right eye, mild stage: Secondary | ICD-10-CM | POA: Diagnosis not present

## 2017-03-09 IMAGING — CR DG HIP (WITH OR WITHOUT PELVIS) 2-3V*R*
2 series · 2 of 2 positions shown · non-contrast
Comparison: None.

CLINICAL DATA: Pain for 4 days

EXAM:
DG HIP   2-3V RIGHT

[w pelvis *]
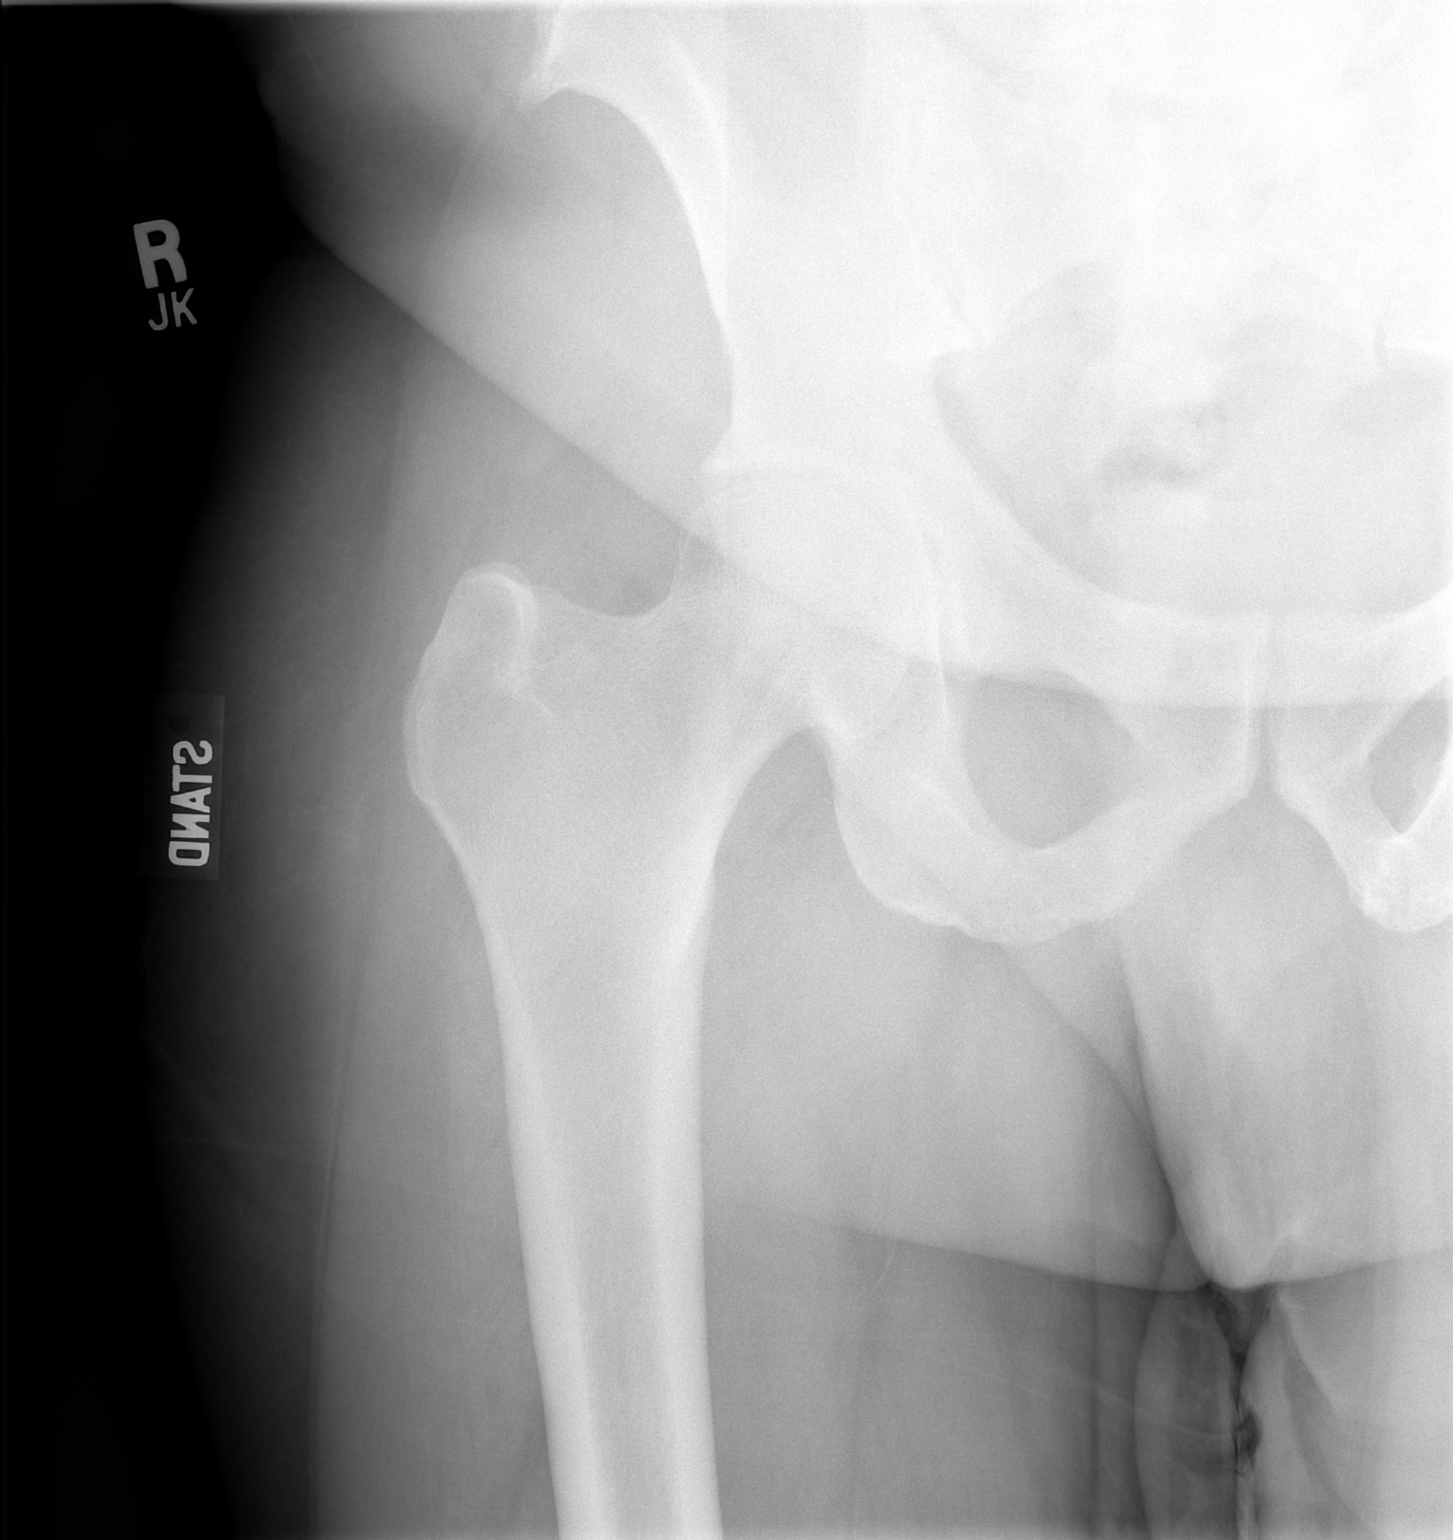

[t hip frog leg right]
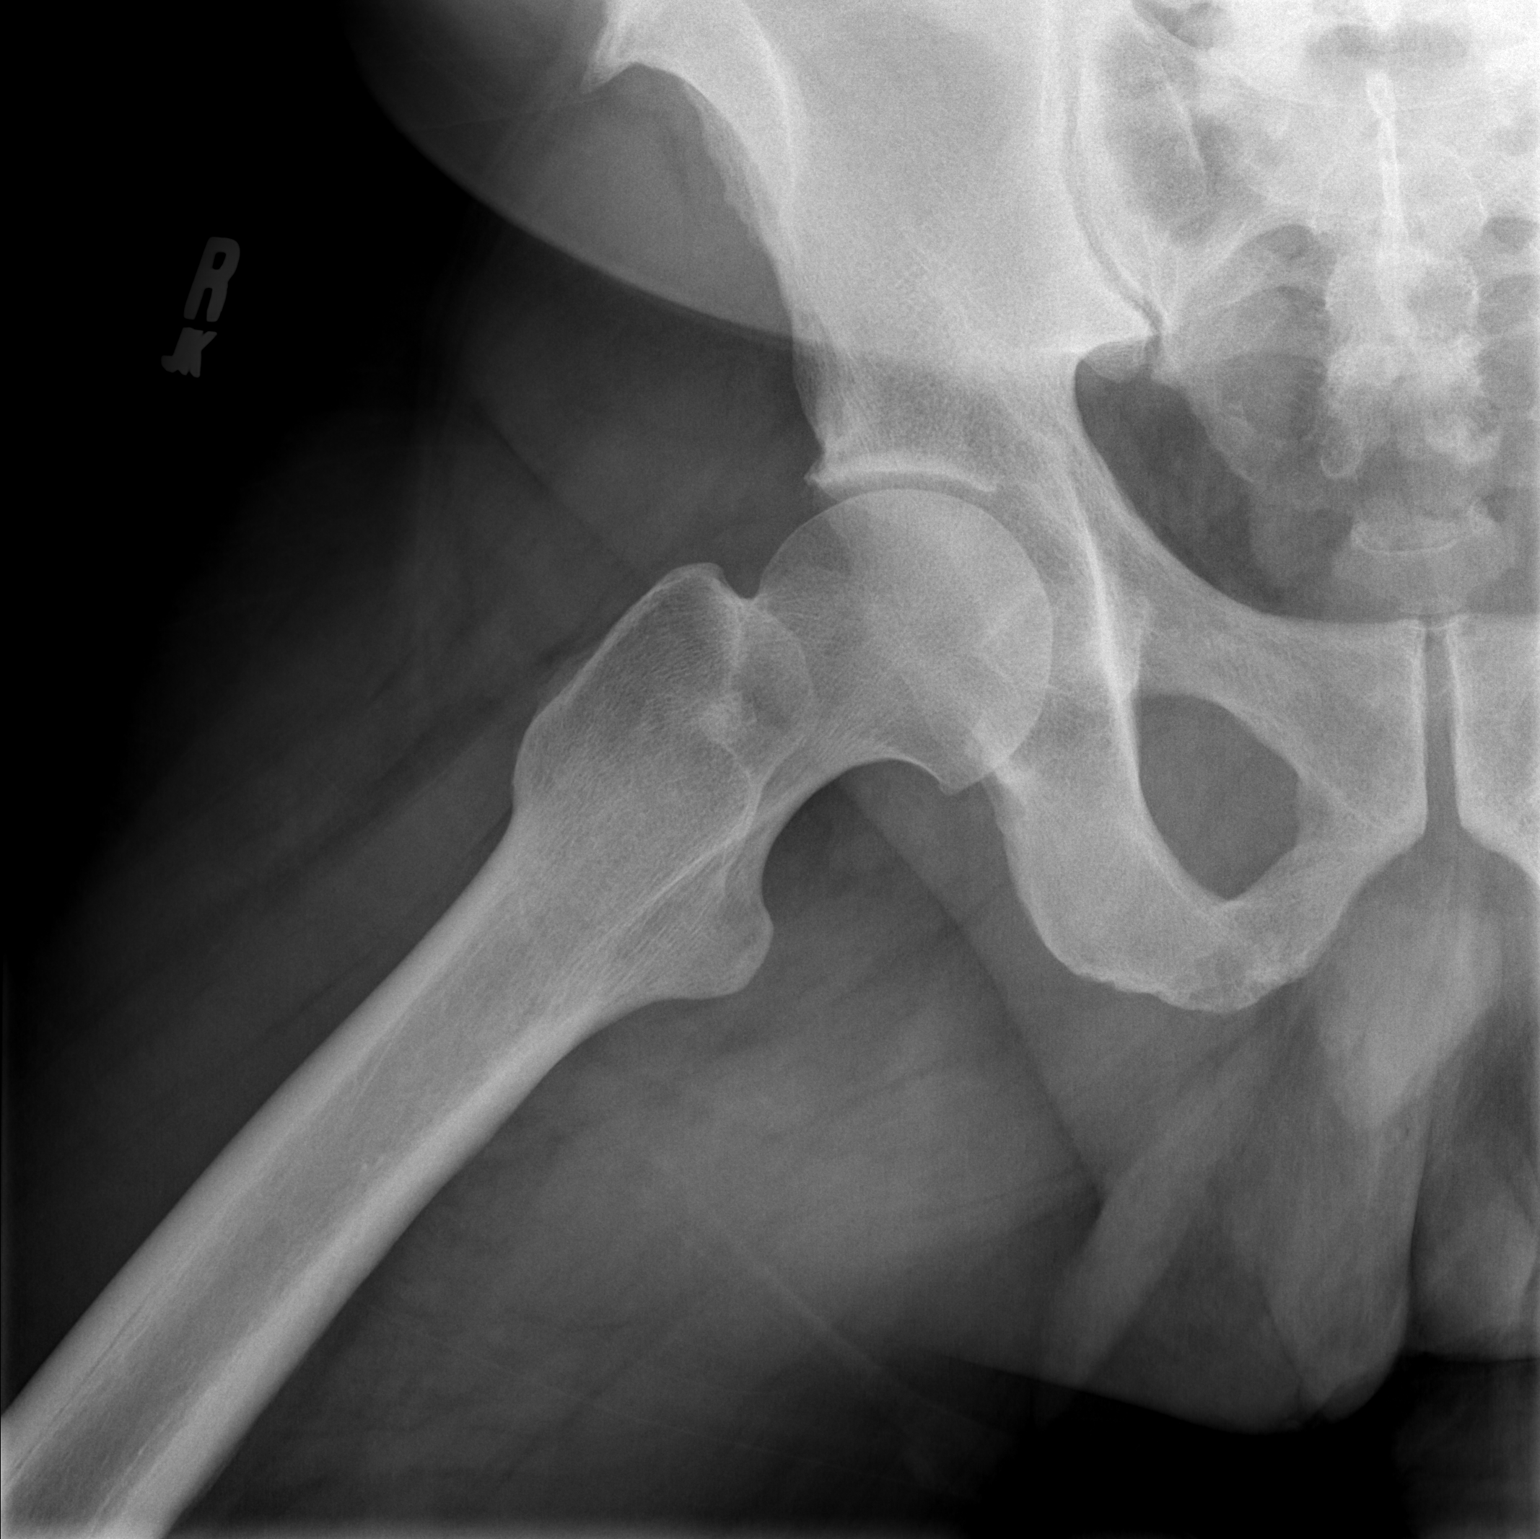

[2 of 2 positions shown; findings below may reference images not displayed]

FINDINGS: Standing frontal and lateral views were obtained. There is slight
narrowing of the right hip joint. No fracture or dislocation. No
erosive change. Right sacroiliac joint appears normal. There is a
small benign exostosis arising from the lateral superior aspect of
the right iliac crest.
IMPRESSION: Slight narrowing right hip joint.  No fracture or dislocation.

## 2017-04-18 DIAGNOSIS — R3915 Urgency of urination: Secondary | ICD-10-CM | POA: Diagnosis not present

## 2017-04-18 DIAGNOSIS — N5201 Erectile dysfunction due to arterial insufficiency: Secondary | ICD-10-CM | POA: Diagnosis not present

## 2017-05-03 DIAGNOSIS — H9313 Tinnitus, bilateral: Secondary | ICD-10-CM | POA: Diagnosis not present

## 2017-05-03 DIAGNOSIS — Z7289 Other problems related to lifestyle: Secondary | ICD-10-CM | POA: Diagnosis not present

## 2017-05-03 DIAGNOSIS — Z87891 Personal history of nicotine dependence: Secondary | ICD-10-CM | POA: Diagnosis not present

## 2017-05-03 DIAGNOSIS — H6122 Impacted cerumen, left ear: Secondary | ICD-10-CM | POA: Diagnosis not present

## 2017-05-20 DIAGNOSIS — H938X2 Other specified disorders of left ear: Secondary | ICD-10-CM | POA: Diagnosis not present

## 2017-05-31 DIAGNOSIS — H938X2 Other specified disorders of left ear: Secondary | ICD-10-CM | POA: Diagnosis not present

## 2017-07-20 DIAGNOSIS — I129 Hypertensive chronic kidney disease with stage 1 through stage 4 chronic kidney disease, or unspecified chronic kidney disease: Secondary | ICD-10-CM | POA: Diagnosis not present

## 2017-07-20 DIAGNOSIS — Z1389 Encounter for screening for other disorder: Secondary | ICD-10-CM | POA: Diagnosis not present

## 2017-07-20 DIAGNOSIS — D72819 Decreased white blood cell count, unspecified: Secondary | ICD-10-CM | POA: Diagnosis not present

## 2017-07-20 DIAGNOSIS — R7301 Impaired fasting glucose: Secondary | ICD-10-CM | POA: Diagnosis not present

## 2017-07-20 DIAGNOSIS — Z0000 Encounter for general adult medical examination without abnormal findings: Secondary | ICD-10-CM | POA: Diagnosis not present

## 2017-07-20 DIAGNOSIS — E785 Hyperlipidemia, unspecified: Secondary | ICD-10-CM | POA: Diagnosis not present

## 2017-07-20 DIAGNOSIS — N182 Chronic kidney disease, stage 2 (mild): Secondary | ICD-10-CM | POA: Diagnosis not present

## 2017-08-25 DIAGNOSIS — H401123 Primary open-angle glaucoma, left eye, severe stage: Secondary | ICD-10-CM | POA: Diagnosis not present

## 2017-08-25 DIAGNOSIS — H2513 Age-related nuclear cataract, bilateral: Secondary | ICD-10-CM | POA: Diagnosis not present

## 2017-08-25 DIAGNOSIS — H401111 Primary open-angle glaucoma, right eye, mild stage: Secondary | ICD-10-CM | POA: Diagnosis not present

## 2018-01-16 DIAGNOSIS — R7303 Prediabetes: Secondary | ICD-10-CM | POA: Diagnosis not present

## 2018-01-16 DIAGNOSIS — N182 Chronic kidney disease, stage 2 (mild): Secondary | ICD-10-CM | POA: Diagnosis not present

## 2018-01-16 DIAGNOSIS — Z125 Encounter for screening for malignant neoplasm of prostate: Secondary | ICD-10-CM | POA: Diagnosis not present

## 2018-01-16 DIAGNOSIS — Z1159 Encounter for screening for other viral diseases: Secondary | ICD-10-CM | POA: Diagnosis not present

## 2018-01-16 DIAGNOSIS — I129 Hypertensive chronic kidney disease with stage 1 through stage 4 chronic kidney disease, or unspecified chronic kidney disease: Secondary | ICD-10-CM | POA: Diagnosis not present

## 2018-01-16 DIAGNOSIS — E785 Hyperlipidemia, unspecified: Secondary | ICD-10-CM | POA: Diagnosis not present

## 2018-03-02 DIAGNOSIS — H2513 Age-related nuclear cataract, bilateral: Secondary | ICD-10-CM | POA: Diagnosis not present

## 2018-03-02 DIAGNOSIS — H401123 Primary open-angle glaucoma, left eye, severe stage: Secondary | ICD-10-CM | POA: Diagnosis not present

## 2018-03-02 DIAGNOSIS — H401112 Primary open-angle glaucoma, right eye, moderate stage: Secondary | ICD-10-CM | POA: Diagnosis not present

## 2018-04-19 DIAGNOSIS — R3915 Urgency of urination: Secondary | ICD-10-CM | POA: Diagnosis not present

## 2018-04-19 DIAGNOSIS — N5201 Erectile dysfunction due to arterial insufficiency: Secondary | ICD-10-CM | POA: Diagnosis not present

## 2018-09-05 DIAGNOSIS — H401112 Primary open-angle glaucoma, right eye, moderate stage: Secondary | ICD-10-CM | POA: Diagnosis not present

## 2018-09-05 DIAGNOSIS — H401123 Primary open-angle glaucoma, left eye, severe stage: Secondary | ICD-10-CM | POA: Diagnosis not present

## 2018-09-05 DIAGNOSIS — H2513 Age-related nuclear cataract, bilateral: Secondary | ICD-10-CM | POA: Diagnosis not present

## 2018-09-06 DIAGNOSIS — Z0000 Encounter for general adult medical examination without abnormal findings: Secondary | ICD-10-CM | POA: Diagnosis not present

## 2018-09-06 DIAGNOSIS — I129 Hypertensive chronic kidney disease with stage 1 through stage 4 chronic kidney disease, or unspecified chronic kidney disease: Secondary | ICD-10-CM | POA: Diagnosis not present

## 2018-09-06 DIAGNOSIS — E669 Obesity, unspecified: Secondary | ICD-10-CM | POA: Diagnosis not present

## 2018-09-06 DIAGNOSIS — E785 Hyperlipidemia, unspecified: Secondary | ICD-10-CM | POA: Diagnosis not present

## 2018-09-06 DIAGNOSIS — N182 Chronic kidney disease, stage 2 (mild): Secondary | ICD-10-CM | POA: Diagnosis not present

## 2018-09-06 DIAGNOSIS — R7303 Prediabetes: Secondary | ICD-10-CM | POA: Diagnosis not present

## 2018-09-28 DIAGNOSIS — H401123 Primary open-angle glaucoma, left eye, severe stage: Secondary | ICD-10-CM | POA: Diagnosis not present

## 2018-09-28 DIAGNOSIS — H401112 Primary open-angle glaucoma, right eye, moderate stage: Secondary | ICD-10-CM | POA: Diagnosis not present

## 2018-09-28 DIAGNOSIS — H2513 Age-related nuclear cataract, bilateral: Secondary | ICD-10-CM | POA: Diagnosis not present

## 2018-11-30 DIAGNOSIS — Z1159 Encounter for screening for other viral diseases: Secondary | ICD-10-CM | POA: Diagnosis not present

## 2018-12-05 DIAGNOSIS — K5730 Diverticulosis of large intestine without perforation or abscess without bleeding: Secondary | ICD-10-CM | POA: Diagnosis not present

## 2018-12-05 DIAGNOSIS — Z86010 Personal history of colonic polyps: Secondary | ICD-10-CM | POA: Diagnosis not present

## 2019-05-09 DIAGNOSIS — R3915 Urgency of urination: Secondary | ICD-10-CM | POA: Diagnosis not present

## 2019-05-09 DIAGNOSIS — N5201 Erectile dysfunction due to arterial insufficiency: Secondary | ICD-10-CM | POA: Diagnosis not present

## 2019-05-17 ENCOUNTER — Ambulatory Visit: Payer: Medicare Other | Attending: Internal Medicine

## 2019-05-17 DIAGNOSIS — Z23 Encounter for immunization: Secondary | ICD-10-CM | POA: Insufficient documentation

## 2019-05-17 NOTE — Progress Notes (Signed)
   Covid-19 Vaccination Clinic  Name:  Richard Hudson    MRN: [id number] DOB: [birth year]  05/17/2019  Richard Hudson was observed post Covid-19 immunization for 15 minutes without incidence. He was provided with Vaccine Information Sheet and instruction to access the V-Safe system.   Richard Hudson was instructed to call 911 with any severe reactions post vaccine: Marland Kitchen Difficulty breathing  . Swelling of your face and throat  . A fast heartbeat  . A bad rash all over your body  . Dizziness and weakness    Immunizations Administered    Name Date Dose VIS Date Route   Pfizer COVID-19 Vaccine 05/17/2019  8:53 AM 0.3 mL 03/02/2019 Intramuscular   Manufacturer: [place]   Lot: [id number]   [place]: [id number]

## 2019-06-12 ENCOUNTER — Ambulatory Visit: Payer: Medicare Other | Attending: Internal Medicine

## 2019-06-12 DIAGNOSIS — Z23 Encounter for immunization: Secondary | ICD-10-CM

## 2019-06-12 NOTE — Progress Notes (Signed)
   Covid-19 Vaccination Clinic  Name:  Richard Hudson    MRN: [id number] DOB: 12-03-1945  06/12/2019  [name] was observed post Covid-19 immunization for 15 minutes without incident. He was provided with Vaccine Information Sheet and instruction to access the V-Safe system.   [name] was instructed to call 911 with any severe reactions post vaccine: Marland Kitchen Difficulty breathing  . Swelling of face and throat  . A fast heartbeat  . A bad rash all over body  . Dizziness and weakness   Immunizations Administered    Name Date Dose VIS Date Route   Pfizer COVID-19 Vaccine 06/12/2019  8:51 AM 0.3 mL 03/02/2019 Intramuscular   Manufacturer: [place]   Lot: [id number]   [place]: [id number]

## 2019-10-02 DIAGNOSIS — I10 Essential (primary) hypertension: Secondary | ICD-10-CM | POA: Diagnosis not present

## 2019-10-02 DIAGNOSIS — N400 Enlarged prostate without lower urinary tract symptoms: Secondary | ICD-10-CM | POA: Diagnosis not present

## 2019-10-02 DIAGNOSIS — E785 Hyperlipidemia, unspecified: Secondary | ICD-10-CM | POA: Diagnosis not present

## 2019-10-11 DIAGNOSIS — H6122 Impacted cerumen, left ear: Secondary | ICD-10-CM | POA: Diagnosis not present

## 2019-12-11 DIAGNOSIS — M7051 Other bursitis of knee, right knee: Secondary | ICD-10-CM | POA: Diagnosis not present

## 2019-12-11 DIAGNOSIS — M25561 Pain in right knee: Secondary | ICD-10-CM | POA: Diagnosis not present

## 2020-01-07 ENCOUNTER — Ambulatory Visit: Payer: Medicare Other | Attending: Internal Medicine

## 2020-01-07 DIAGNOSIS — Z23 Encounter for immunization: Secondary | ICD-10-CM

## 2020-01-07 NOTE — Progress Notes (Signed)
   Covid-19 Vaccination Clinic  Name:  ANDREI MCCOOK    MRN: [id number] DOB: Dec 04, 1945  01/07/2020  Mr. Overbaugh was observed post Covid-19 immunization for 15 minutes without incident. He was provided with Vaccine Information Sheet and instruction to access the V-Safe system.   Mr. Muzquiz was instructed to call 911 with any severe reactions post vaccine: Marland Kitchen Difficulty breathing  . Swelling of face and throat  . A fast heartbeat  . A bad rash all over body  . Dizziness and weakness

## 2020-01-29 ENCOUNTER — Ambulatory Visit: Payer: Medicare Other

## 2020-03-07 ENCOUNTER — Other Ambulatory Visit: Payer: Self-pay | Admitting: Family Medicine

## 2020-03-07 ENCOUNTER — Ambulatory Visit
Admission: RE | Admit: 2020-03-07 | Discharge: 2020-03-07 | Disposition: A | Discharge status: Home / Self Care | Payer: Medicare Other | Source: Ambulatory Visit | Attending: Family Medicine | Admitting: Family Medicine

## 2020-03-07 DIAGNOSIS — R079 Chest pain, unspecified: Secondary | ICD-10-CM

## 2020-03-07 DIAGNOSIS — I10 Essential (primary) hypertension: Secondary | ICD-10-CM | POA: Diagnosis not present

## 2020-03-07 DIAGNOSIS — M25561 Pain in right knee: Secondary | ICD-10-CM | POA: Diagnosis not present

## 2020-03-07 DIAGNOSIS — R7303 Prediabetes: Secondary | ICD-10-CM | POA: Diagnosis not present

## 2020-03-07 DIAGNOSIS — E785 Hyperlipidemia, unspecified: Secondary | ICD-10-CM | POA: Diagnosis not present

## 2020-03-07 DIAGNOSIS — M25562 Pain in left knee: Secondary | ICD-10-CM | POA: Diagnosis not present

## 2020-03-30 NOTE — Progress Notes (Deleted)
Cardiology Office Note:    Date:  03/30/2020   ID:  [name], DOB [birth year], MRN [id number]  PCP:  Pcp, No  Cardiologist:  No primary care provider on file.  Electrophysiologist:  None   Referring MD: Harlan Stains, MD   No chief complaint on file. ***  History of Present Illness:    Richard Hudson is a 75 y.o. male with a hx of hypertension, hyperlipidemia, OSA who is referred by Dr. Dema Severin for evaluation of chest pain.  Past Medical History:  Diagnosis Date  . Glaucoma   . Hx of eye surgery    eye lid sugery2013  . Hyperlipidemia   . Hypertension   . Hypogonadism male   . Sleep apnea   . Whooping cough    as a child    Past Surgical History:  Procedure Laterality Date  . TONSILLECTOMY     1990    Current Medications: No outpatient medications have been marked as taking for the 04/02/20 encounter (Appointment) with Donato Heinz, MD.     Allergies:   Patient has no known allergies.   Social History   Socioeconomic History  . Marital status: Married    Spouse name: Not on file  . Number of children: Not on file  . Years of education: Not on file  . Highest education level: Not on file  Occupational History  . Not on file  Tobacco Use  . Smoking status: Not on file  . Smokeless tobacco: Not on file  Substance and Sexual Activity  . Alcohol use: Not on file  . Drug use: Not on file  . Sexual activity: Not on file  Other Topics Concern  . Not on file  Social History Narrative  . Not on file   Social Determinants of Health   Financial Resource Strain: Not on file  Food Insecurity: Not on file  Transportation Needs: Not on file  Physical Activity: Not on file  Stress: Not on file  Social Connections: Not on file     Family History: The patient's ***family history is not on file.  ROS:   Please see the history of present illness.    *** All other systems reviewed and are negative.  EKGs/Labs/Other Studies Reviewed:     The following studies were reviewed today: ***  EKG:  EKG is *** ordered today.  The ekg ordered today demonstrates ***  Recent Labs: No results found for requested labs within last 8760 hours.  Recent Lipid Panel No results found for: CHOL, TRIG, HDL, CHOLHDL, VLDL, LDLCALC, LDLDIRECT  Physical Exam:    VS:  There were no vitals taken for this visit.    Wt Readings from Last 3 Encounters:  No data found for Wt     GEN: *** Well nourished, well developed in no acute distress HEENT: Normal NECK: No JVD; No carotid bruits LYMPHATICS: No lymphadenopathy CARDIAC: ***RRR, no murmurs, rubs, gallops RESPIRATORY:  Clear to auscultation without rales, wheezing or rhonchi  ABDOMEN: Soft, non-tender, non-distended MUSCULOSKELETAL:  No edema; No deformity  SKIN: Warm and dry NEUROLOGIC:  Alert and oriented x 3 PSYCHIATRIC:  Normal affect   ASSESSMENT:    No diagnosis found. PLAN:    Chest pain:  Hypertension:  Hyperlipidemia:  RTC in***  Medication Adjustments/Labs and Tests Ordered: Current medicines are reviewed at length with the patient today.  Concerns regarding medicines are outlined above.  No orders of the defined types were placed in this encounter.  No  orders of the defined types were placed in this encounter.   There are no Patient Instructions on file for this visit.   Signed, Donato Heinz, MD  03/30/2020 4:09 PM    [facility]

## 2020-03-31 DIAGNOSIS — H401123 Primary open-angle glaucoma, left eye, severe stage: Secondary | ICD-10-CM | POA: Diagnosis not present

## 2020-03-31 DIAGNOSIS — H2513 Age-related nuclear cataract, bilateral: Secondary | ICD-10-CM | POA: Diagnosis not present

## 2020-03-31 DIAGNOSIS — H401112 Primary open-angle glaucoma, right eye, moderate stage: Secondary | ICD-10-CM | POA: Diagnosis not present

## 2020-04-02 ENCOUNTER — Ambulatory Visit: Payer: Medicare Other | Admitting: Cardiology

## 2020-04-12 ENCOUNTER — Encounter: Payer: Self-pay | Admitting: Cardiology

## 2020-04-12 DIAGNOSIS — R072 Precordial pain: Secondary | ICD-10-CM | POA: Insufficient documentation

## 2020-04-12 NOTE — Progress Notes (Signed)
Cardiology Office Note   Date:  04/14/2020   ID:  Richard Hudson, Richard Hudson 04-Jan-1946, MRN [id number]  PCP:  Harlan Stains, MD  Cardiologist:   No primary care provider on file. Referring:  Harlan Stains, MD  Chief Complaint  Patient presents with  . precordial chest pain      History of Present Illness: Richard Hudson is a 75 y.o. male who was referred by Harlan Stains, MD for evaluation of chest pain.  The patient has no past cardiac history.  He does have a history of difficult to control hypertension and is referred in part for management of this.  He also has had chest discomfort that he says has been going on for some time and probably years.  It lasts for a few seconds at a time.  He says it feels like he has a sharp pain when he has to take a deep breath occasionally.  Occurs at rest.  There are no associated symptoms such as nausea vomiting or diaphoresis.  He does not report any palpitations, presyncope or syncope.  He has no PND or orthopnea.  He does not think about having any discomfort when he is active.  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  He rides an exercise bicycle 30 minutes a day.  With these activities he denies any cardiovascular symptoms.  Past Medical History:  Diagnosis Date  . Glaucoma   . Hyperlipidemia   . Hypertension   . Hypogonadism male   . Sleep apnea   . Whooping cough    as a child    Past Surgical History:  Procedure Laterality Date  . Eyelid Surgery    . TONSILLECTOMY     1990     Current Outpatient Medications  Medication Sig Dispense Refill  . amLODipine (NORVASC) 10 MG tablet Take 10 mg by mouth daily.    Marland Kitchen ascorbic acid (VITAMIN C) 500 MG tablet Take 500 mg by mouth daily.    . brimonidine (ALPHAGAN) 0.2 % ophthalmic solution 1 drop 2 (two) times daily.    . Cholecalciferol 10 MCG (400 UNIT) CAPS Take 1 capsule by mouth daily.    . dorzolamide-timolol (COSOPT) 22.3-6.8 MG/ML  ophthalmic solution 1 drop in both eyes    . fesoterodine (TOVIAZ) 8 MG TB24 tablet Take 8 mg by mouth every other day.    . metoprolol (TOPROL-XL) 200 MG 24 hr tablet Take 1 tablet by mouth daily.    Marland Kitchen ROCKLATAN 0.02-0.005 % SOLN Apply 1 drop to eye at bedtime.    . simvastatin (ZOCOR) 20 MG tablet Take 20 mg by mouth every evening.    . Vitamin D, Cholecalciferol, 25 MCG (1000 UT) CAPS 1 tablet     No current facility-administered medications for this visit.    Allergies:   Patient has no known allergies.    Social History:  The patient  reports that he quit smoking about 33 years ago. His smoking use included cigarettes. He has never used smokeless tobacco. He reports current alcohol use. He reports current drug use. Drug: Marijuana.   Family History:  The patient's family history includes Colon cancer in his father; Hypertension in his mother; Prostate cancer in his father.    ROS:  Please see the history of present illness.   Otherwise, review of systems are positive for none.   All other systems are reviewed and negative.    PHYSICAL EXAM: VS:  BP 112/66 (BP  Location: Left Arm, Patient Position: Sitting)   Pulse 69   Ht 5\' 8"  (1.727 m)   Wt 246 lb 9.6 oz (111.9 kg)   SpO2 96%   BMI 37.50 kg/m  , BMI Body mass index is 37.5 kg/m. GENERAL:  Well appearing HEENT:  Pupils equal round and reactive, fundi not visualized, oral mucosa unremarkable NECK:  No jugular venous distention, waveform within normal limits, carotid upstroke brisk and symmetric, no bruits, no thyromegaly LYMPHATICS:  No cervical, inguinal adenopathy LUNGS:  Clear to auscultation bilaterally BACK:  No CVA tenderness CHEST:  Unremarkable HEART:  PMI not displaced or sustained,S1 and S2 within normal limits, no S3, no S4, no clicks, no rubs, no murmurs ABD:  Flat, positive bowel sounds normal in frequency in pitch, no bruits, no rebound, no guarding, no midline pulsatile mass, no hepatomegaly, no  splenomegaly EXT:  2 plus pulses throughout, no edema, no cyanosis no clubbing SKIN:  No rashes no nodules NEURO:  Cranial nerves II through XII grossly intact, motor grossly intact throughout PSYCH:  Cognitively intact, oriented to person place and time    EKG:  EKG is ordered today. The ekg ordered today demonstrates sinus rhythm, rate 69, axis within normal limits, intervals within normal limits, diffuse nonspecific T wave flattening.   Recent Labs: No results found for requested labs within last 8760 hours.    Lipid Panel No results found for: CHOL, TRIG, HDL, CHOLHDL, VLDL, LDLCALC, LDLDIRECT    Wt Readings from Last 3 Encounters:  04/14/20 246 lb 9.6 oz (111.9 kg)      Other studies Reviewed: Additional studies/ records that were reviewed today include: Primary care office records including labs. Review of the above records demonstrates:  Please see elsewhere in the note.     ASSESSMENT AND PLAN:  CHEST PAIN: His chest pain is very atypical.  He has no significant cardiovascular risk factors.  His exam is unremarkable.  I think the pretest probability of obstructive coronary disease is low.  I do not think further cardiovascular testing is suggested.  HTN: His blood pressure today is good.  He says it is a little bit labile at home and he is going to keep a 2-week blood pressure diary and mail that back to me.  For now I will continue the meds as listed.   Current medicines are reviewed at length with the patient today.  The patient does not have concerns regarding medicines.  The following changes have been made:  no change  Labs/ tests ordered today include: None No orders of the defined types were placed in this encounter.    Disposition:   FU with me as needed   Signed, Minus Breeding, MD  04/14/2020 12:18 PM    [facility]

## 2020-04-14 ENCOUNTER — Encounter: Payer: Self-pay | Admitting: Cardiology

## 2020-04-14 ENCOUNTER — Ambulatory Visit (INDEPENDENT_AMBULATORY_CARE_PROVIDER_SITE_OTHER): Payer: Medicare Other | Admitting: Cardiology

## 2020-04-14 ENCOUNTER — Other Ambulatory Visit: Payer: Self-pay

## 2020-04-14 VITALS — BP 112/66 | HR 69 | Ht 68.0 in | Wt 246.6 lb

## 2020-04-14 DIAGNOSIS — R072 Precordial pain: Secondary | ICD-10-CM | POA: Diagnosis not present

## 2020-04-14 NOTE — Patient Instructions (Signed)
Medication Instructions:  No changes *If you need a refill on your cardiac medications before your next appointment, please call your pharmacy*   Lab Work: None ordered If you have labs (blood work) drawn today and your tests are completely normal, you will receive your results only by: Marland Kitchen MyChart Message (if you have MyChart) OR . A paper copy in the mail If you have any lab test that is abnormal or we need to change your treatment, we will call you to review the results.   Testing/Procedures: None ordered   Follow-Up: At [facility], you and your health needs are our priority.  As part of our continuing mission to provide you with exceptional heart care, we have created designated Provider Care Teams.  These Care Teams include your primary Cardiologist (physician) and Advanced Practice Providers (APPs -  Physician Assistants and Nurse Practitioners) who all work together to provide you with the care you need, when you need it.  We recommend signing up for the patient portal called "MyChart".  Sign up information is provided on this After Visit Summary.  MyChart is used to connect with patients for Virtual Visits (Telemedicine).  Patients are able to view lab/test results, encounter notes, upcoming appointments, etc.  Non-urgent messages can be sent to your provider as well.   To learn more about what you can do with MyChart, go to [email or web address].    Your next appointment:   Follow up as needed with Dr. Percival Spanish   Other Instructions Please check your blood pressure daily with the log provided and mail back when completed.

## 2020-04-16 NOTE — Addendum Note (Signed)
Addended by: Orma Render on: 04/16/2020 10:49 AM   Modules accepted: Orders

## 2020-04-18 DIAGNOSIS — H409 Unspecified glaucoma: Secondary | ICD-10-CM | POA: Diagnosis not present

## 2020-04-18 DIAGNOSIS — I10 Essential (primary) hypertension: Secondary | ICD-10-CM | POA: Diagnosis not present

## 2020-04-18 DIAGNOSIS — N400 Enlarged prostate without lower urinary tract symptoms: Secondary | ICD-10-CM | POA: Diagnosis not present

## 2020-04-18 DIAGNOSIS — E785 Hyperlipidemia, unspecified: Secondary | ICD-10-CM | POA: Diagnosis not present

## 2020-05-15 ENCOUNTER — Telehealth: Payer: Self-pay

## 2020-05-15 NOTE — Telephone Encounter (Signed)
Spoke with patient about recent blood pressure log. No changes to regimen at this time. Patient verbalized understanding.

## 2020-06-25 DIAGNOSIS — R3915 Urgency of urination: Secondary | ICD-10-CM | POA: Diagnosis not present

## 2020-06-25 DIAGNOSIS — N5201 Erectile dysfunction due to arterial insufficiency: Secondary | ICD-10-CM | POA: Diagnosis not present

## 2020-09-18 DIAGNOSIS — E785 Hyperlipidemia, unspecified: Secondary | ICD-10-CM | POA: Diagnosis not present

## 2020-09-18 DIAGNOSIS — I10 Essential (primary) hypertension: Secondary | ICD-10-CM | POA: Diagnosis not present

## 2020-09-18 DIAGNOSIS — N400 Enlarged prostate without lower urinary tract symptoms: Secondary | ICD-10-CM | POA: Diagnosis not present

## 2020-09-18 DIAGNOSIS — Z0000 Encounter for general adult medical examination without abnormal findings: Secondary | ICD-10-CM | POA: Diagnosis not present

## 2020-09-18 DIAGNOSIS — R7303 Prediabetes: Secondary | ICD-10-CM | POA: Diagnosis not present

## 2020-10-02 DIAGNOSIS — H401112 Primary open-angle glaucoma, right eye, moderate stage: Secondary | ICD-10-CM | POA: Diagnosis not present

## 2020-10-02 DIAGNOSIS — H401123 Primary open-angle glaucoma, left eye, severe stage: Secondary | ICD-10-CM | POA: Diagnosis not present

## 2020-10-03 DIAGNOSIS — U071 COVID-19: Secondary | ICD-10-CM | POA: Diagnosis not present

## 2020-10-10 DIAGNOSIS — Z1389 Encounter for screening for other disorder: Secondary | ICD-10-CM | POA: Diagnosis not present

## 2020-11-06 DIAGNOSIS — H5340 Unspecified visual field defects: Secondary | ICD-10-CM | POA: Diagnosis not present

## 2020-11-18 DIAGNOSIS — H1131 Conjunctival hemorrhage, right eye: Secondary | ICD-10-CM | POA: Diagnosis not present

## 2020-11-18 DIAGNOSIS — H401123 Primary open-angle glaucoma, left eye, severe stage: Secondary | ICD-10-CM | POA: Diagnosis not present

## 2020-11-18 DIAGNOSIS — H401112 Primary open-angle glaucoma, right eye, moderate stage: Secondary | ICD-10-CM | POA: Diagnosis not present

## 2020-11-27 DIAGNOSIS — H6122 Impacted cerumen, left ear: Secondary | ICD-10-CM | POA: Diagnosis not present

## 2021-03-05 DIAGNOSIS — F5101 Primary insomnia: Secondary | ICD-10-CM | POA: Diagnosis not present

## 2021-03-05 DIAGNOSIS — R7303 Prediabetes: Secondary | ICD-10-CM | POA: Diagnosis not present

## 2021-03-05 DIAGNOSIS — E785 Hyperlipidemia, unspecified: Secondary | ICD-10-CM | POA: Diagnosis not present

## 2021-03-05 DIAGNOSIS — I10 Essential (primary) hypertension: Secondary | ICD-10-CM | POA: Diagnosis not present

## 2021-04-09 DIAGNOSIS — R7309 Other abnormal glucose: Secondary | ICD-10-CM | POA: Diagnosis not present

## 2021-04-09 DIAGNOSIS — H401112 Primary open-angle glaucoma, right eye, moderate stage: Secondary | ICD-10-CM | POA: Diagnosis not present

## 2021-04-09 DIAGNOSIS — H401123 Primary open-angle glaucoma, left eye, severe stage: Secondary | ICD-10-CM | POA: Diagnosis not present

## 2021-04-09 DIAGNOSIS — H2513 Age-related nuclear cataract, bilateral: Secondary | ICD-10-CM | POA: Diagnosis not present

## 2021-04-11 IMAGING — DX DG CHEST 2V
2 series · 2 of 2 positions shown · non-contrast
Comparison: 05/26/2005

CLINICAL DATA: Chest pain

EXAM:
CHEST - 2 VIEW

[dg chest 2 view (1 of 2)]
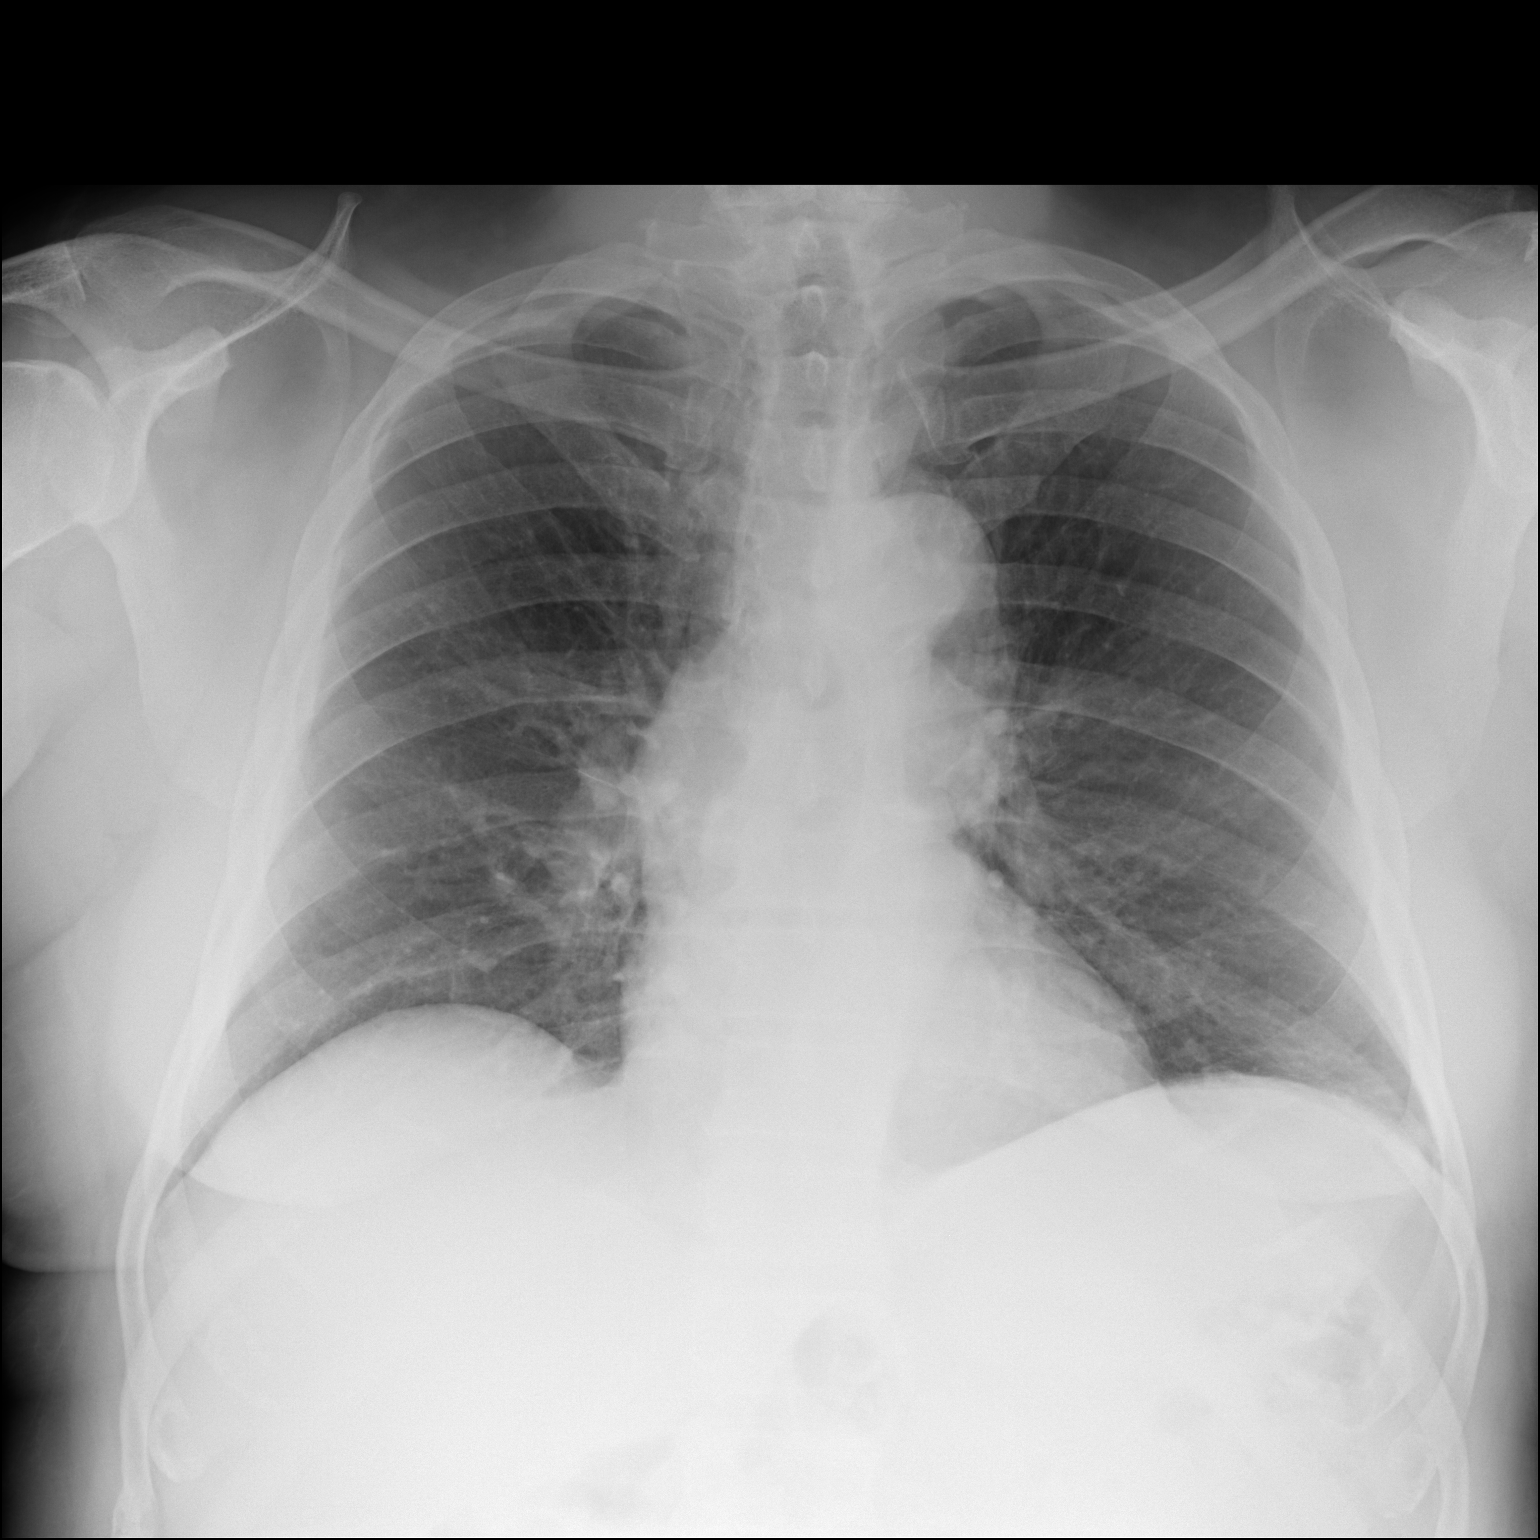

[dg chest 2 view (2 of 2)]
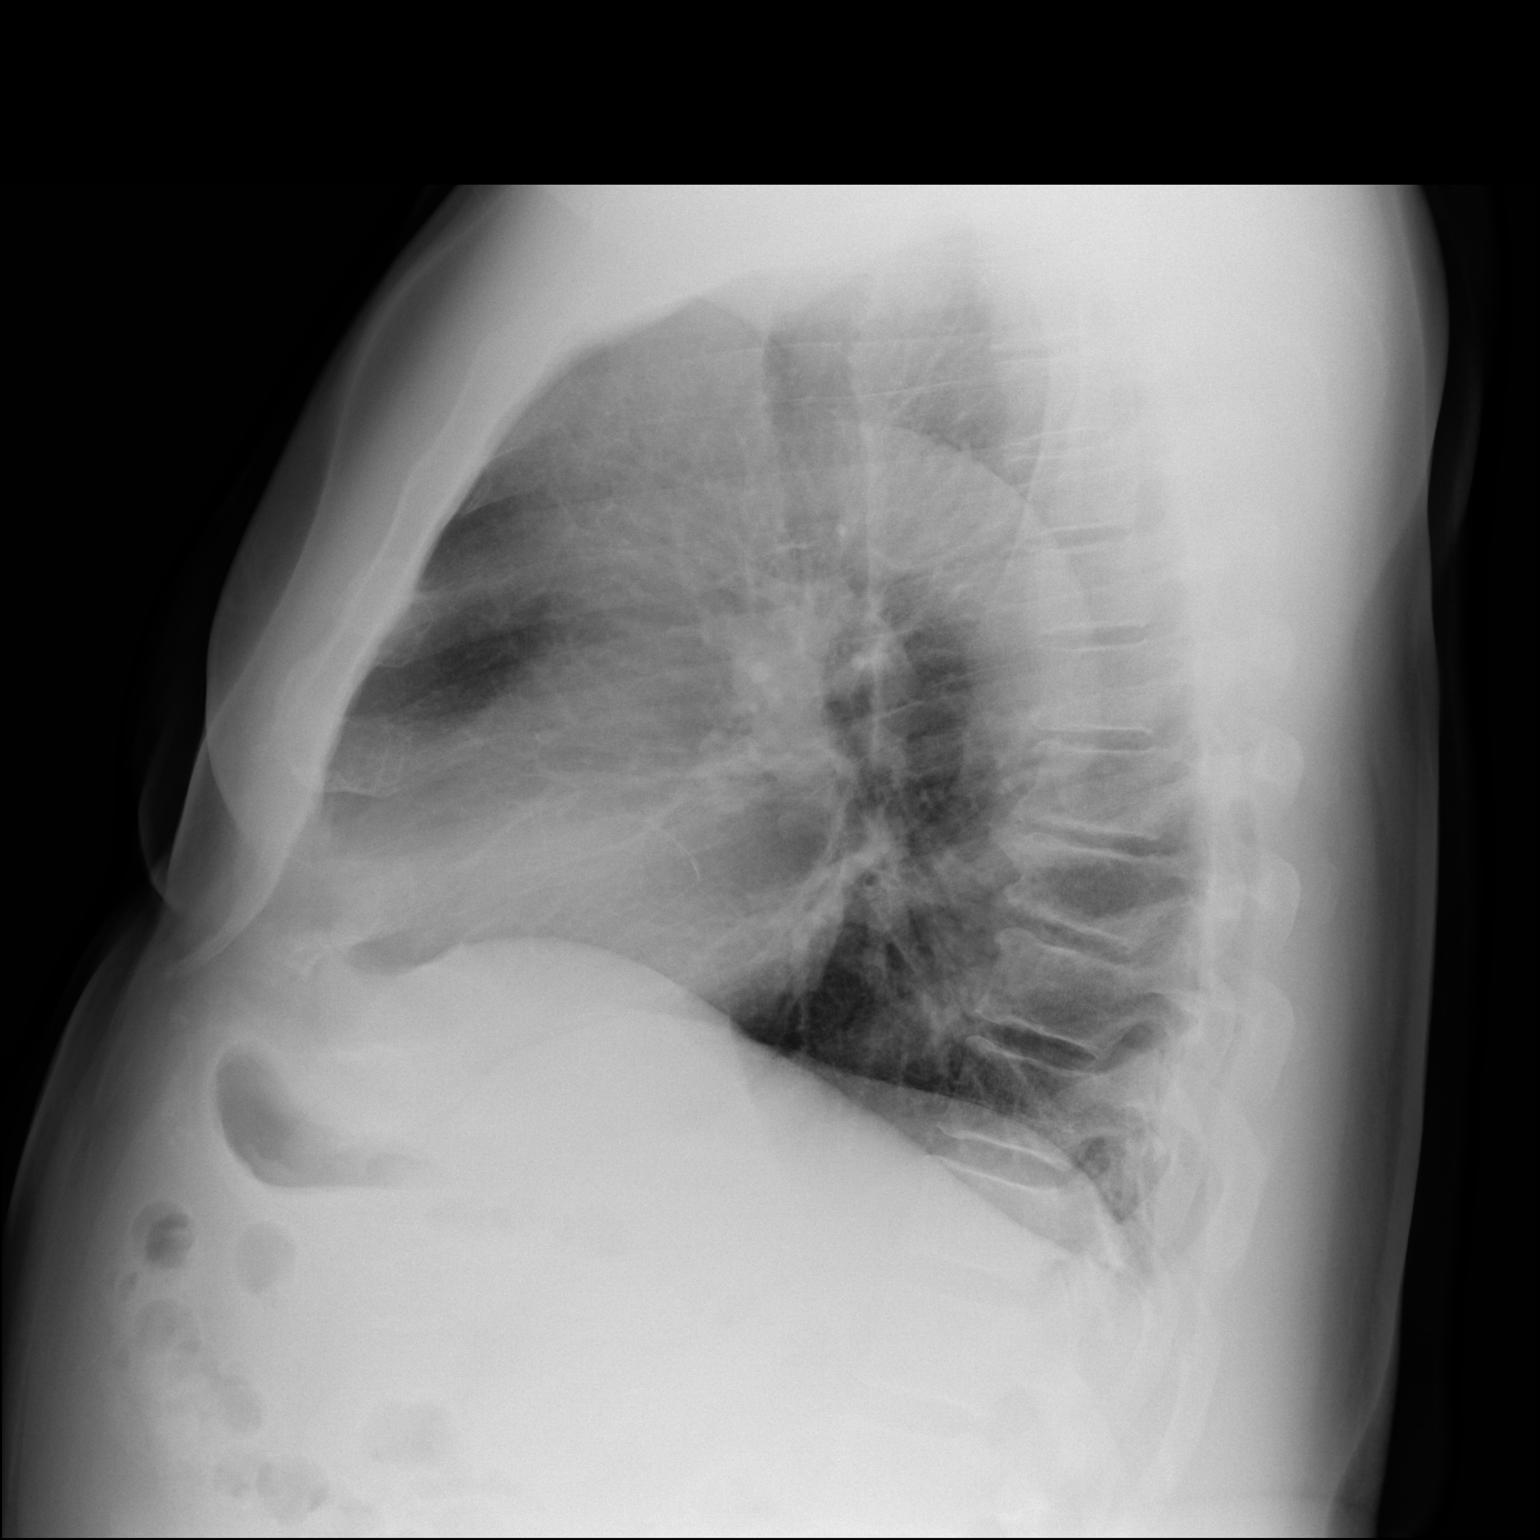

[2 of 2 positions shown; findings below may reference images not displayed]

FINDINGS: The heart size and mediastinal contours are within normal limits.
Both lungs are clear. The visualized skeletal structures are
unremarkable.
IMPRESSION: No active cardiopulmonary disease.

## 2021-04-16 DIAGNOSIS — H6123 Impacted cerumen, bilateral: Secondary | ICD-10-CM | POA: Diagnosis not present

## 2021-07-22 DIAGNOSIS — N5201 Erectile dysfunction due to arterial insufficiency: Secondary | ICD-10-CM | POA: Diagnosis not present

## 2021-07-22 DIAGNOSIS — N3281 Overactive bladder: Secondary | ICD-10-CM | POA: Diagnosis not present

## 2021-07-22 DIAGNOSIS — R3915 Urgency of urination: Secondary | ICD-10-CM | POA: Diagnosis not present

## 2021-09-23 DIAGNOSIS — J069 Acute upper respiratory infection, unspecified: Secondary | ICD-10-CM | POA: Diagnosis not present

## 2021-09-28 DIAGNOSIS — R69 Illness, unspecified: Secondary | ICD-10-CM | POA: Diagnosis not present

## 2021-09-28 DIAGNOSIS — R7303 Prediabetes: Secondary | ICD-10-CM | POA: Diagnosis not present

## 2021-09-28 DIAGNOSIS — N400 Enlarged prostate without lower urinary tract symptoms: Secondary | ICD-10-CM | POA: Diagnosis not present

## 2021-09-28 DIAGNOSIS — I10 Essential (primary) hypertension: Secondary | ICD-10-CM | POA: Diagnosis not present

## 2021-09-28 DIAGNOSIS — E669 Obesity, unspecified: Secondary | ICD-10-CM | POA: Diagnosis not present

## 2021-09-28 DIAGNOSIS — Z23 Encounter for immunization: Secondary | ICD-10-CM | POA: Diagnosis not present

## 2021-09-28 DIAGNOSIS — Z0000 Encounter for general adult medical examination without abnormal findings: Secondary | ICD-10-CM | POA: Diagnosis not present

## 2021-09-28 DIAGNOSIS — E785 Hyperlipidemia, unspecified: Secondary | ICD-10-CM | POA: Diagnosis not present

## 2021-09-28 DIAGNOSIS — R052 Subacute cough: Secondary | ICD-10-CM | POA: Diagnosis not present

## 2021-10-07 DIAGNOSIS — H2513 Age-related nuclear cataract, bilateral: Secondary | ICD-10-CM | POA: Diagnosis not present

## 2021-10-07 DIAGNOSIS — H401123 Primary open-angle glaucoma, left eye, severe stage: Secondary | ICD-10-CM | POA: Diagnosis not present

## 2021-11-03 DIAGNOSIS — I10 Essential (primary) hypertension: Secondary | ICD-10-CM | POA: Diagnosis not present

## 2021-11-03 DIAGNOSIS — E785 Hyperlipidemia, unspecified: Secondary | ICD-10-CM | POA: Diagnosis not present

## 2021-11-04 DIAGNOSIS — U071 COVID-19: Secondary | ICD-10-CM | POA: Diagnosis not present

## 2021-11-04 DIAGNOSIS — Z20822 Contact with and (suspected) exposure to covid-19: Secondary | ICD-10-CM | POA: Diagnosis not present

## 2021-11-17 DIAGNOSIS — U071 COVID-19: Secondary | ICD-10-CM | POA: Diagnosis not present

## 2021-11-27 DIAGNOSIS — H6123 Impacted cerumen, bilateral: Secondary | ICD-10-CM | POA: Diagnosis not present

## 2022-04-01 DIAGNOSIS — I10 Essential (primary) hypertension: Secondary | ICD-10-CM | POA: Diagnosis not present

## 2022-04-01 DIAGNOSIS — E785 Hyperlipidemia, unspecified: Secondary | ICD-10-CM | POA: Diagnosis not present

## 2022-04-01 DIAGNOSIS — E669 Obesity, unspecified: Secondary | ICD-10-CM | POA: Diagnosis not present

## 2022-04-01 DIAGNOSIS — K219 Gastro-esophageal reflux disease without esophagitis: Secondary | ICD-10-CM | POA: Diagnosis not present

## 2022-04-01 DIAGNOSIS — R7309 Other abnormal glucose: Secondary | ICD-10-CM | POA: Diagnosis not present

## 2022-04-01 DIAGNOSIS — R7303 Prediabetes: Secondary | ICD-10-CM | POA: Diagnosis not present

## 2023-04-25 ENCOUNTER — Other Ambulatory Visit (HOSPITAL_COMMUNITY): Payer: Self-pay | Admitting: Family Medicine

## 2023-04-25 DIAGNOSIS — E785 Hyperlipidemia, unspecified: Secondary | ICD-10-CM

## 2023-05-04 ENCOUNTER — Ambulatory Visit (HOSPITAL_COMMUNITY)
Admission: RE | Admit: 2023-05-04 | Discharge: 2023-05-04 | Disposition: A | Discharge status: Home / Self Care | Payer: Self-pay | Source: Ambulatory Visit | Attending: Family Medicine | Admitting: Family Medicine

## 2023-05-04 DIAGNOSIS — E785 Hyperlipidemia, unspecified: Secondary | ICD-10-CM | POA: Insufficient documentation
